# Patient Record
Sex: Female | Born: 1935 | Race: White | Hispanic: No | Marital: Single | Smoking: Never smoker
Health system: Southern US, Community
[De-identification: ages and names within clinical notes are randomized; demographics above are authoritative.]

## PROBLEM LIST (undated history)

## (undated) DIAGNOSIS — M1990 Unspecified osteoarthritis, unspecified site: Secondary | ICD-10-CM

## (undated) DIAGNOSIS — N390 Urinary tract infection, site not specified: Secondary | ICD-10-CM

## (undated) DIAGNOSIS — I10 Essential (primary) hypertension: Secondary | ICD-10-CM

## (undated) DIAGNOSIS — G929 Unspecified toxic encephalopathy: Secondary | ICD-10-CM

## (undated) DIAGNOSIS — M47812 Spondylosis without myelopathy or radiculopathy, cervical region: Secondary | ICD-10-CM

## (undated) DIAGNOSIS — F0390 Unspecified dementia without behavioral disturbance: Secondary | ICD-10-CM

---

## 2007-03-08 ENCOUNTER — Ambulatory Visit: Payer: Self-pay | Admitting: Cardiology

## 2007-09-14 ENCOUNTER — Ambulatory Visit: Payer: Self-pay | Admitting: Family Medicine

## 2007-09-14 ENCOUNTER — Ambulatory Visit: Payer: Self-pay | Admitting: Cardiology

## 2007-09-14 ENCOUNTER — Observation Stay (HOSPITAL_COMMUNITY): Admission: EM | Admit: 2007-09-14 | Discharge: 2007-09-15 | Payer: Self-pay | Admitting: Emergency Medicine

## 2007-09-15 ENCOUNTER — Encounter: Payer: Self-pay | Admitting: Family Medicine

## 2009-02-17 IMAGING — CT CT HEAD W/O CM
1 series · 16 of 30 positions shown, 20 images · non-contrast
Comparison: None

CLINICAL DATA: Blurred vision

CT HEAD WITHOUT CONTRAST
TECHNIQUE: Contiguous axial images were obtained from the base of
the skull through the vertex without contrast.

[Series 2: head routine 4.8 h37s · axial · 0.43mm/px · z∈[-158,-27]mm · 16 of 30 slices shown, 20 images]
[im 2/30  brain]
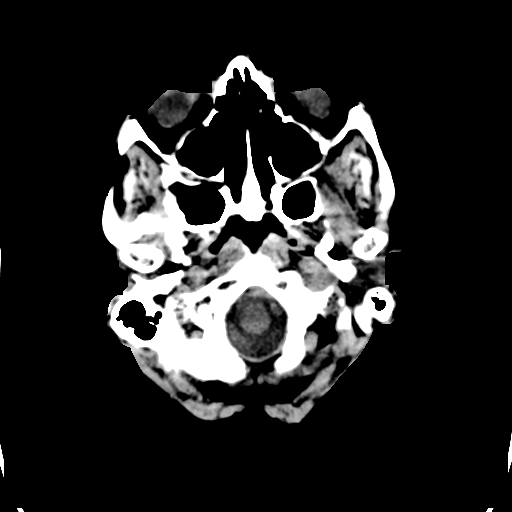
[im 2/30  bone]
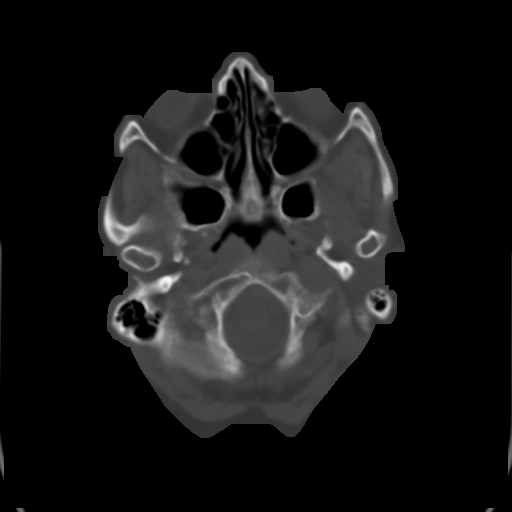
[im 4/30  brain]
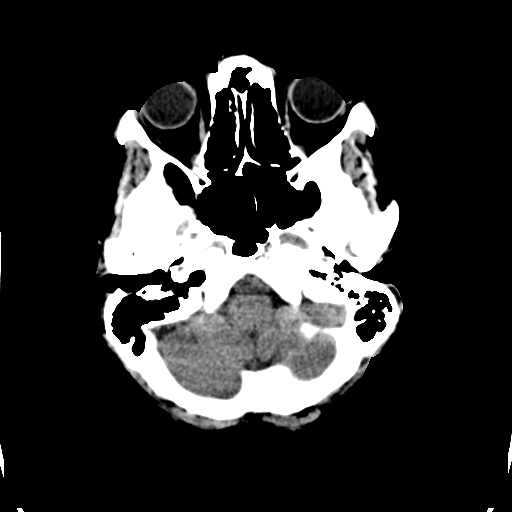
[im 6/30  brain]
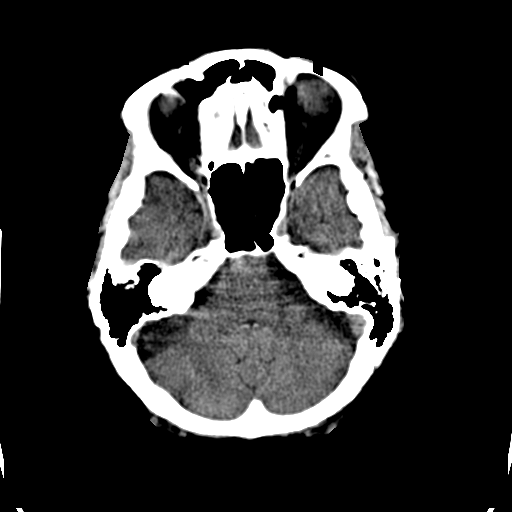
[im 8/30  brain]
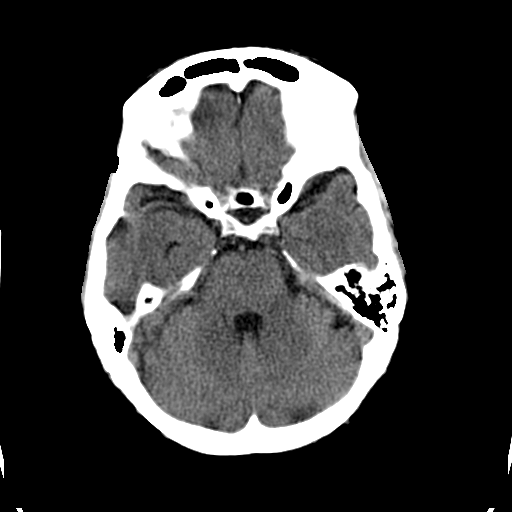
[im 9/30  brain]
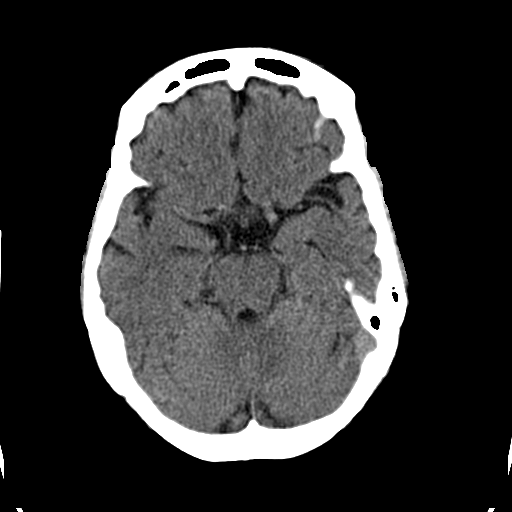
[im 9/30  bone]
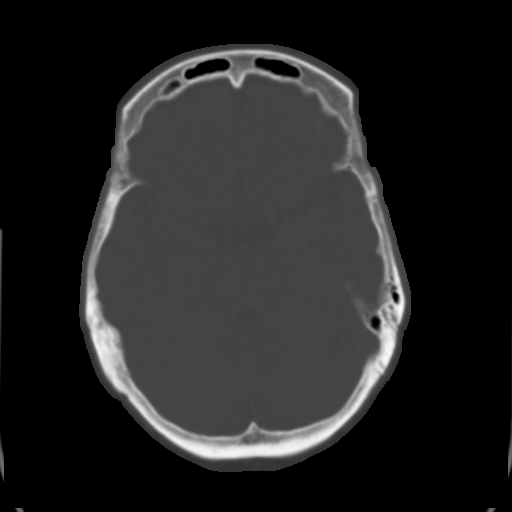
[im 11/30  brain]
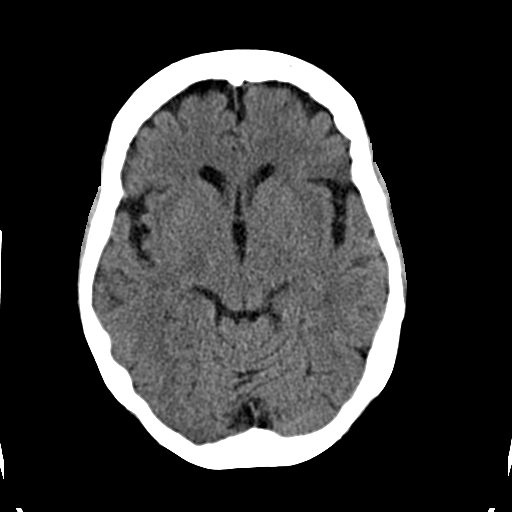
[im 13/30  brain]
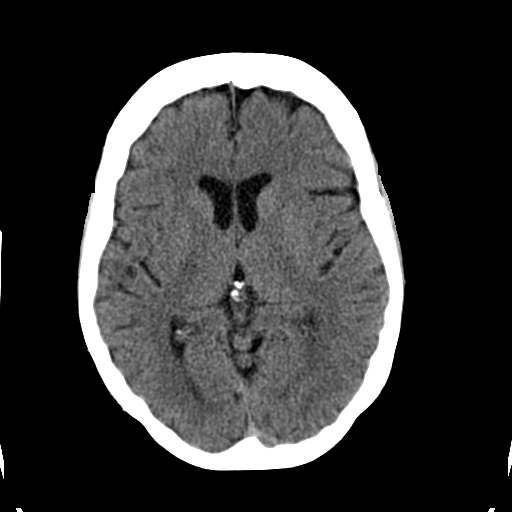
[im 15/30  brain]
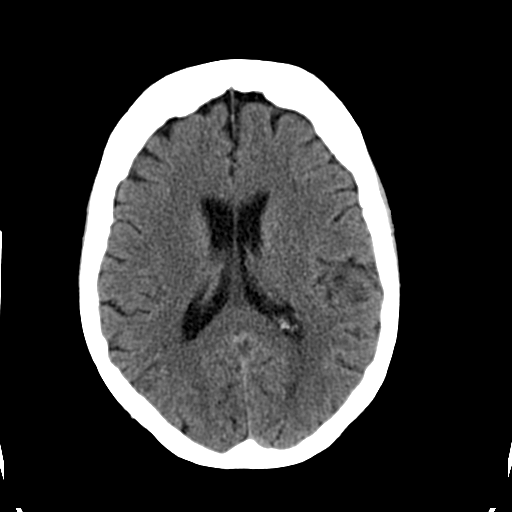
[im 16/30  brain]
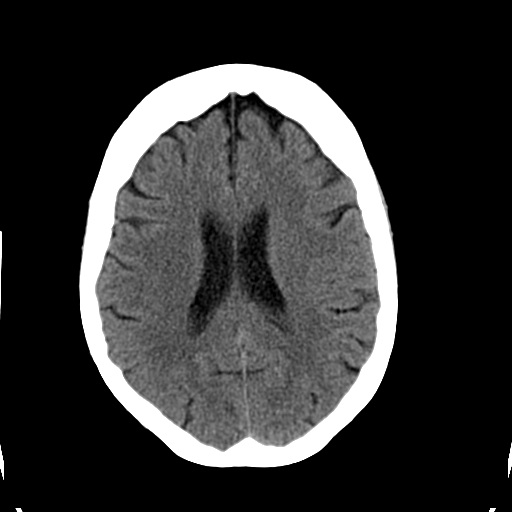
[im 16/30  bone]
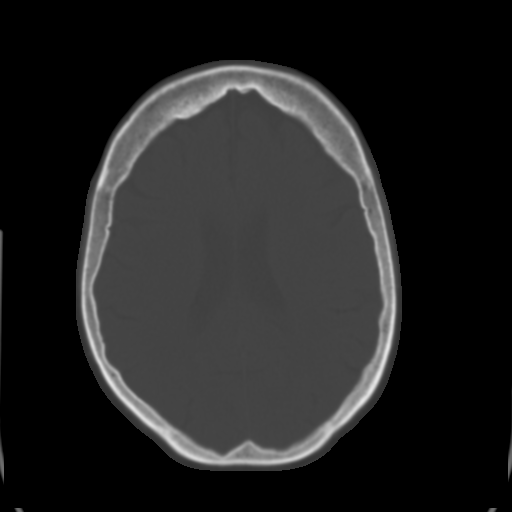
[im 18/30  brain]
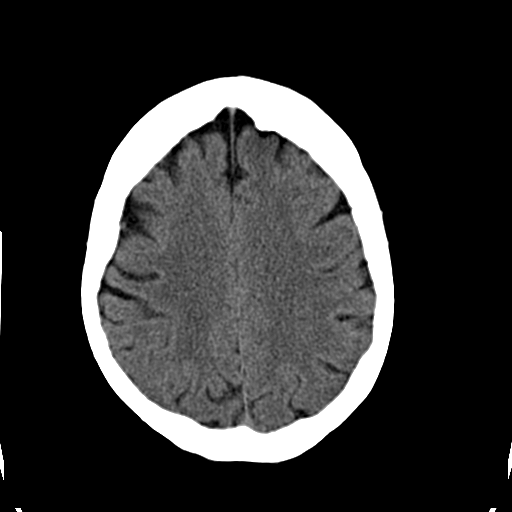
[im 20/30  brain]
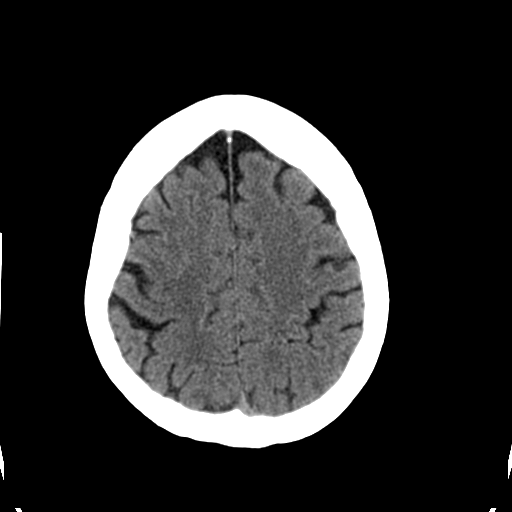
[im 22/30  brain]
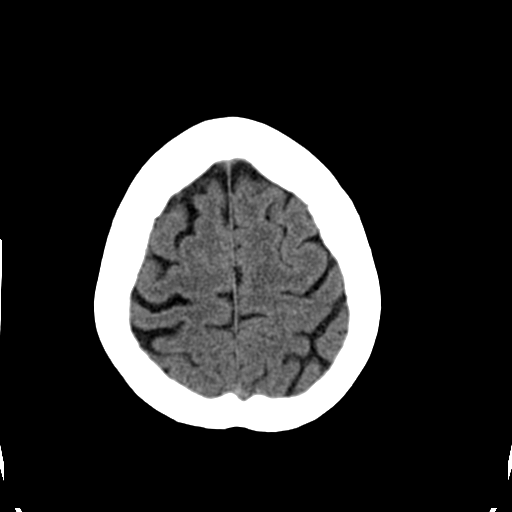
[im 23/30  brain]
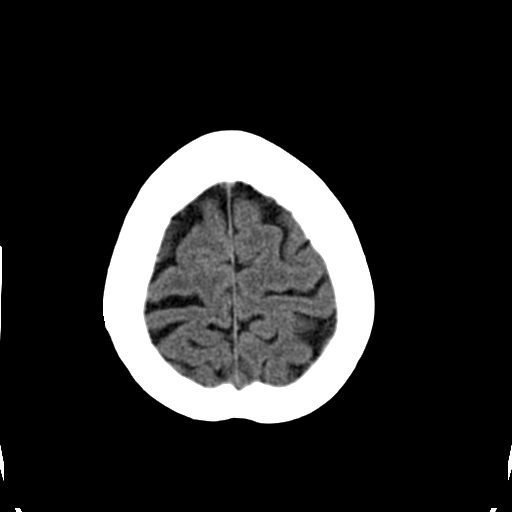
[im 23/30  bone]
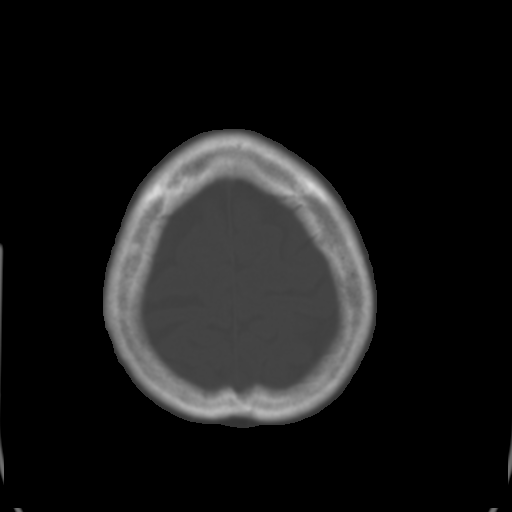
[im 25/30  brain]
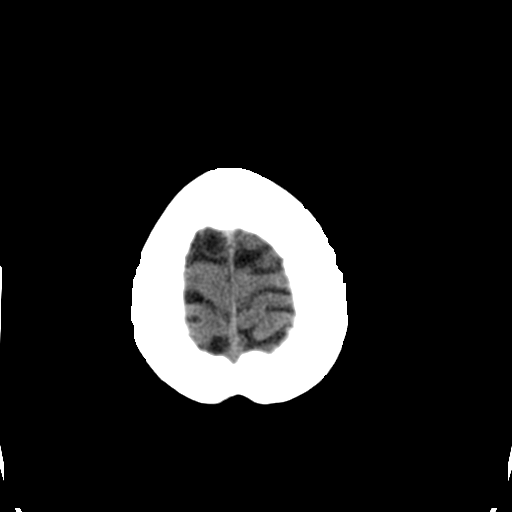
[im 27/30  brain]
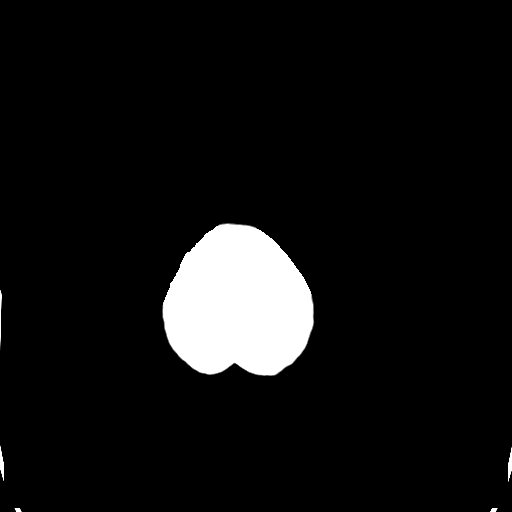
[im 29/30  brain]
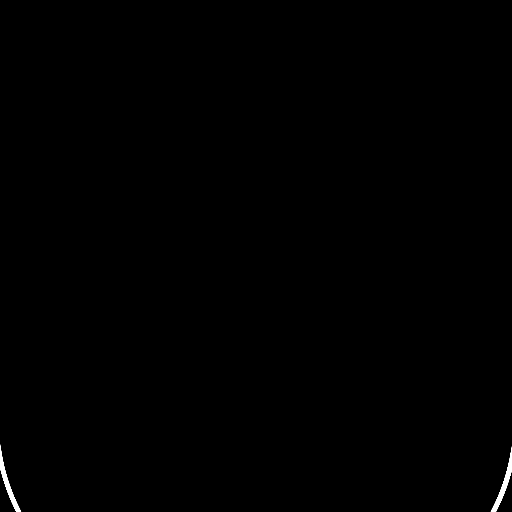

[16 of 30 positions shown; findings below may reference images not displayed]

FINDINGS: Ventricular size and CSF spaces normal for age.  No acute
or focal abnormality.  Calvarium intact.  No fluid in the sinuses
visualized.
IMPRESSION: No acute or focal abnormality.

## 2009-09-16 DIAGNOSIS — I10 Essential (primary) hypertension: Secondary | ICD-10-CM | POA: Insufficient documentation

## 2009-09-16 DIAGNOSIS — H269 Unspecified cataract: Secondary | ICD-10-CM

## 2009-09-16 DIAGNOSIS — E785 Hyperlipidemia, unspecified: Secondary | ICD-10-CM | POA: Insufficient documentation

## 2009-09-18 ENCOUNTER — Ambulatory Visit: Payer: Self-pay | Admitting: Cardiology

## 2009-09-18 DIAGNOSIS — R002 Palpitations: Secondary | ICD-10-CM

## 2009-11-12 ENCOUNTER — Ambulatory Visit: Payer: Self-pay | Admitting: Cardiology

## 2010-06-30 NOTE — Miscellaneous (Signed)
Summary: Camera operator Party Release Form   Imported By: Roderic Ovens 10/02/2009 14:45:36  _____________________________________________________________________  External Attachment:    Type:   Image     Comment:   External Document

## 2010-06-30 NOTE — Assessment & Plan Note (Signed)
Summary: f4y   Visit Type:  Follow-up Primary Provider:  Dr. Magdalene Patricia, MD  CC:  Palpitations and HTN.  History of Present Illness: The patient presents for evaluation of hypertension. She also is describing palpitations. I last saw her for palpitations in 2008. At that time she had an echo which was normal. She was managed conservatively. Her hypertension was treated with hydrochlorothiazide. However, she apparently did not tolerate this medication for unclear reasons. More recently she has been complaining of her heart beating hard. This happens at night when she's resting or during the day. It may be somewhat fast or skip. She might feel it in her throat. She doesn't have any presyncope or syncope. She is not sure how long it lasts. She is not able to bring it on. She has not described chest pressure, neck or arm discomfort. She does not have any shortness of breath, PND or orthopnea. Interestingly she started eating more potatoes and bananas and symptoms have improved dramatically.  Concerning the patient's hypertension she says her blood pressures at home are systolics in the 150s. She was started on a low dose of metoprolol but says she was unable to move her right leg with this and discontinued this medication.  Current Medications (verified): 1)  Multivitamins   Tabs (Multiple Vitamin) .Marland Kitchen.. 1 By Mouth Daiy 2)  Cod Liver Oil  Caps (Cod Liver Oil) .Marland Kitchen.. 1 By Mouth Daily 3)  Flax Seed Oil 1000 Mg Caps (Flaxseed (Linseed)) .Marland Kitchen.. 1 By Mouth Daily  Allergies (verified): 1)  ! Metoprolol Succinate  Past History:  Past Medical History: Reviewed history from 09/16/2009 and no changes required.  1. Hypertension.   2. Hyperlipidemia.   3. Cataracts.   Past Surgical History: Reviewed history from 09/16/2009 and no changes required.  None  Family History: Reviewed history from 09/16/2009 and no changes required. Noncontributory for early coronary artery disease.  Sister with sudden  death (age 8), brother with sudden death (age 23s or 29s)  Social History: Reviewed history from 09/16/2009 and no changes required. Patient is retired.  She is single.  She has 3  children.  She has never smoked cigarettes and does not drink alcohol.      Review of Systems       As stated in the HPI and negative for all other systems.   Vital Signs:  Patient profile:   77 year old female Height:      60 inches Weight:      113 pounds BMI:     22.15 Pulse rate:   77 / minute Resp:     16 per minute BP sitting:   178 / 80  (right arm)  Vitals Entered By: Marrion Coy, CNA (September 18, 2009 3:41 PM)  Physical Exam  General:  Well developed, well nourished, in no acute distress. Head:  normocephalic and atraumatic Eyes:  PERRLA/EOM intact; conjunctiva and lids normal. Mouth:  Teeth, gums and palate normal. Oral mucosa normal. Neck:  Neck supple, no JVD. No masses, thyromegaly or abnormal cervical nodes. Chest Wall:  no deformities or breast masses noted Lungs:  Clear bilaterally to auscultation and percussion. Abdomen:  Bowel sounds positive; abdomen soft and non-tender without masses, organomegaly, or hernias noted. No hepatosplenomegaly. Msk:  Back normal, normal gait. Muscle strength and tone normal. Extremities:  No clubbing or cyanosis. Neurologic:  Alert and oriented x 3. Skin:  Intact without lesions or rashes. Cervical Nodes:  no significant adenopathy Axillary Nodes:  no significant adenopathy  Inguinal Nodes:  no significant adenopathy Psych:  Normal affect.   Detailed Cardiovascular Exam  Neck    Carotids: Carotids full and equal bilaterally without bruits.      Neck Veins: Normal, no JVD.    Heart    Inspection: no deformities or lifts noted.      Palpation: normal PMI with no thrills palpable.      Auscultation: regular rate and rhythm, S1, S2 without murmurs, rubs, gallops, or clicks.    Vascular    Abdominal Aorta: no palpable masses, pulsations, or  audible bruits.      Femoral Pulses: normal femoral pulses bilaterally.      Pedal Pulses: normal pedal pulses bilaterally.      Radial Pulses: normal radial pulses bilaterally.      Peripheral Circulation: no clubbing, cyanosis, or edema noted with normal capillary refill.     EKG  Procedure date:  09/18/2009  Findings:      sinus rhythm, rate 77, axis within normal limits, intervals within normal limits, no acute ST-T wave changes, poor anterior R-wave progression  Impression & Recommendations:  Problem # 1:  PALPITATIONS (ICD-785.1) Her palpitations are not severe.  They seem to be better with change in diet. He isn't been ongoing for quite a while. At this point I will forego further evaluation and only pursue this if she has increasing symptoms, presyncope or syncope. Orders: EKG w/ Interpretation (93000)  Problem # 2:  HYPERTENSION (ICD-401.9) Her blood pressure does require treatment. I think she will probably be sensitive to multiple medications. I will try amlodipine 2.5 mg daily. She will keep a night on her blood pressure.  Problem # 3:  HYPERLIPIDEMIA (ICD-272.4) I will defer to her primary physician. In the absence of coronary artery disease is very likely that dietary and food supplements would be the best treatment in this patient with multiple intolerances  Patient Instructions: 1)  Your physician recommends that you schedule a follow-up appointment in: 2 months with Dr Carthage Lions 2)  Your physician has recommended you make the following change in your medication: Amlodipine 2.5 mg daily (filled at [facility] [employer]) 3)  You have been diagnosed with palpitations. Palpitations are described as a gradual or sudden awareness of the beating of your heart. It may last seconds, minutes, hours, or days and may be caused by the heart beating slower, faster, more strongly, or more irregularly than normal. They are very common and usually not dangerous. Please see the  handout/brochure given to you today for more information. Prescriptions: AMLODIPINE BESYLATE 2.5 MG TABS (AMLODIPINE BESYLATE) one daily  #30 x 6   Entered by:   Charolotte Capuchin, RN   Authorized by:   Rollene Rotunda, MD, [facility]   Signed by:   Charolotte Capuchin, RN on 09/18/2009   Method used:   Electronically to        [facility] (retail)       [address]       [place], [place]  [address]       Ph: [phone] or [phone]       Fax: [phone]   RxID:   [id number]

## 2010-06-30 NOTE — Assessment & Plan Note (Signed)
Summary: 2 month rov/sl   Visit Type:  Follow-up Primary Provider:  Dr. Magdalene Patricia, MD  CC:  Hypertension.  History of Present Illness: The patient  presents for followup of hypertension. At the last appointment I started her on a low dose of Norvasc. She does feel a little fatigued with this medicine but her blood pressures have been well controlled with systolics in the 130s. She's not really noticing much in the way of palpitations. This may be because stress has gotten less for her. She is not feeling any presyncope or syncope. She is not having any shortness of breath, PND orthopnea. She did bump her right leg and has a large hematoma.  Current Medications (verified): 1)  Multivitamins   Tabs (Multiple Vitamin) .Marland Kitchen.. 1 By Mouth Daiy 2)  Cod Liver Oil  Caps (Cod Liver Oil) .Marland Kitchen.. 1 By Mouth Daily 3)  Flax Seed Oil 1000 Mg Caps (Flaxseed (Linseed)) .Marland Kitchen.. 1 By Mouth Daily 4)  Amlodipine Besylate 2.5 Mg Tabs (Amlodipine Besylate) .... One Daily  Allergies (verified): 1)  ! Metoprolol Succinate  Past History:  Past Medical History: Reviewed history from 09/16/2009 and no changes required.  1. Hypertension.   2. Hyperlipidemia.   3. Cataracts.   Past Surgical History: Reviewed history from 09/16/2009 and no changes required.  None  Review of Systems       As stated in the HPI and negative for all other systems.   Vital Signs:  Patient profile:   77 year old female Height:      60 inches Weight:      112 pounds BMI:     21.95 Pulse rate:   74 / minute BP sitting:   148 / 72  (right arm)  Vitals Entered By: Marrion Coy, CNA (November 12, 2009 10:52 AM)  Physical Exam  General:  Well developed, well nourished, in no acute distress. Head:  normocephalic and atraumatic Neck:  Neck supple, no JVD. No masses, thyromegaly or abnormal cervical nodes. Chest Wall:  no deformities or breast masses noted Lungs:  Clear bilaterally to auscultation and percussion. Abdomen:  Bowel  sounds positive; abdomen soft and non-tender without masses, organomegaly, or hernias noted. No hepatosplenomegaly. Msk:  Back normal, normal gait. Muscle strength and tone normal. Pulses:  pulses normal in all 4 extremities Extremities:  large hematoma right anterior tibial Neurologic:  Alert and oriented x 3. Skin:  Intact without lesions or rashes. Psych:  Normal affect.   Detailed Cardiovascular Exam  Neck    Carotids: Carotids full and equal bilaterally without bruits.      Neck Veins: Normal, no JVD.    Heart    Inspection: no deformities or lifts noted.      Palpation: normal PMI with no thrills palpable.      Auscultation: regular rate and rhythm, S1, S2 without murmurs, rubs, gallops, or clicks.    Vascular    Abdominal Aorta: no palpable masses, pulsations, or audible bruits.      Femoral Pulses: normal femoral pulses bilaterally.      Pedal Pulses: normal pedal pulses bilaterally.      Radial Pulses: normal radial pulses bilaterally.      Peripheral Circulation: no clubbing, cyanosis, or edema noted with normal capillary refill.     Impression & Recommendations:  Problem # 1:  PALPITATIONS (ICD-785.1) Though she was having some bigeminy while I listened to her she is not feeling this. Therefore, no further cardiovascular testing or treatment is necessary.  Problem #  2:  HYPERTENSION (ICD-401.9) Her blood pressure is well controlled and she will continue the meds as listed.  Patient Instructions: 1)  Your physician recommends that you schedule a follow-up appointment as needed 2)  Your physician recommends that you continue on your current medications as directed. Please refer to the Current Medication list given to you today.

## 2010-10-13 NOTE — Assessment & Plan Note (Signed)
[facility]                            CARDIOLOGY OFFICE NOTE   [name], [name]                       MRN:          [id number]  DATE:03/08/2007                            DOB:          [birth year]    PRIMARY:  Dr. Magdalene Patricia.   REASON FOR PRESENTATION:  Evaluate patient with hypertension and  palpitations.   HISTORY OF PRESENT ILLNESS:  Patient is a lovely 76 year old white  female without a prior cardiac history other than hypertension.  Dr.  Pincus Badder has tried to address this, it is apparent, over many visits.  She  has been tried on hydrochlorothiazide and Exforge but she has been  intolerant of these drugs and does not take them consistently.  She said  she keeps her blood pressure at home and it is typically under 140  systolic.  However, I note blood pressures in his office at 180 and  today she is at 151 systolic.  She does have a blood pressure cuff at  home but does not use it routinely.   She really presents for evaluation of palpitations.  She said this  happened about a month ago.  Mostly happened on a couple of evenings.  She noticed it mostly at night.  She would feel either a rapid beat or  some irregularity.  She never had any presyncope or syncope associated  with this.  She could not associate it with activity or with any food or  drink.  She did not have any chest discomfort, neck or arm discomfort.  Says she is actually not being bothered by it currently.  She has had no  shortness of breath.  She denies any PND or orthopnea.  She is active  walking and exercising with senior citizens.   PAST MEDICAL HISTORY:  1. Hypertension.  2. Dyslipidemia.   PAST SURGICAL HISTORY:  None.   ALLERGIES:  NONE.   MEDICATIONS:  None.   SOCIAL HISTORY:  Patient is retired.  She is single.  She has 3  children.  She has never smoked cigarettes and does not drink alcohol.   FAMILY HISTORY:  Noncontributory for early coronary artery  disease.   REVIEW OF SYSTEMS:  As stated in the HPI and positive for some joint  pains, negative for all other systems.   PHYSICAL EXAMINATION:  The patient is in no distress.  Blood pressure  151/77, heart rate 74 and regular, weight 123 pounds, body mass index  22.  HEENT:  Eyelids unremarkable, pupils equally round and reactive to  light, fundi not visualized, oral mucosa unremarkable.  NECK:  No jugular venous distention, wave form within normal limits,  carotid upstroke brisk and symmetrical, no bruits, no thyromegaly.  LYMPHATICS:  No cervical, axillary or inguinal adenopathy.  LUNGS:  Clear to auscultation bilaterally.  BACK:  No costovertebral angle tenderness.  CHEST:  Unremarkable.  HEART:  PMI not displaced or sustained, S1 and S2 within normal limits,  no S3, no S4, no clicks, no rubs, no murmurs.  ABDOMEN:  Flat, positive bowel sounds normal in frequency and pitch, no  bruits,  no rebound, no guarding, no midline pulsatile mass, no  hepatomegaly, splenomegaly.  SKIN:  No rashes, no nodules.  EXTREMITIES:  With 2+ pulses throughout, no edema, no cyanosis, no  clubbing.  NEURO:  Oriented to person, place, and time; cranial nerves II-XII  grossly intact, motor grossly intact.   ASSESSMENT/PLAN:  1. Palpitations.  Patient was having some rather mild palpitations      without associated symptoms.  These do not seem to be occurring      recently.  At this point diagnostic testing would not be helpful.      I do note that she had thyroid studies and electrolytes earlier      this year.  Certainly if she has more palpitations she should try      to see if she can associate it with anything else that is going on      and with diet.  She should have followup to her thyroid and      electrolytes.  However, in the absence of this no further      cardiovascular testing is suggested.  We talked about this quite a      while.  2. Hypertension.  Her blood pressure is slightly  elevated today.  She      is obviously reluctant to take medications.  I have encouraged her      to keep a blood pressure diary and to share this with Dr. Pincus Badder.      I have suggested hydrochlorothiazide was the appropriate drug to      use initially and it is uncommon to be intolerant of this.  She      seems like she would be accepting of this as needed prescribed by      Dr. Pincus Badder.  She will present to him with her blood pressure diary.  3. Followup.  She is welcome to come back to this clinic at any time      and is encouraged to if she has any further palpitations.     Rollene Rotunda, MD, [facility]  Electronically Signed    JH/MedQ  DD: 03/08/2007  DT: 03/08/2007  Job #: [id number]   cc:   Magdalene Patricia, MD

## 2010-10-13 NOTE — H&P (Signed)
NAMELEIALOHA, HANNA              ACCOUNT NO.:  [id number]   MEDICAL RECORD NO.:  [id number]          PATIENT TYPE:  OBV   LOCATION:  1825                         FACILITY:  [facility]   PHYSICIAN:  Ruthe Mannan, M.D.       DATE OF BIRTH:  1923-06-29   DATE OF ADMISSION:  09/14/2007  DATE OF DISCHARGE:                              HISTORY & PHYSICAL   PRIMARY MEDICAL DOCTOR:  Dr. Pincus Badder in [place].   TIME SEEN:  10 a.m.   CONSULTANT:  [facility].   CHIEF COMPLAINT:  Told to come to ED for carotid Dopplers.   HISTORY OF PRESENT ILLNESS:  This is an 77 year old very pleasant white  female with past medical history of hypertension and hyperlipidemia,  both of which are diet and exercise controlled, who presents to ED after  a cardiologist advised to come for TIA rule out.  She complains of 3-  week history of bilateral top visual field blurriness.  She states  episodes last a few minutes.  No associated symptoms, but does endorse a  recent history of orthostatic dizziness and fatigue.  Denies syncope,  chest pain, and shortness of breath.  Also denies any hemiparesis or  facial droop.  She saw ophthalmologist yesterday, who told her vision  was normal, but she called cardiologist for TIA workup.  She would  desire surgery if CA deemed necessary.   PAST MEDICAL HISTORY:  1. Hypertension.  2. Hyperlipidemia.  3. Cataracts.   SOCIAL HISTORY:  She is retired and lives alone.  She has 3 children,  who live nearby.  She is very active.  She has never smoked cigarettes.  Denies alcohol.   MEDICATIONS:  None.   ALLERGIES:  No known drug allergies.   PAST SURGICAL HISTORY:  Bilateral cataract surgery in 1993 and 1999.   REVIEW OF SYSTEMS:  See HPI.  In general, she endorses fatigue.  Denies  weight loss or fevers.  RESPIRATORY:  No shortness of breath.  CARDIOVASCULAR:  No palpitations.  No chest pain.  NEURO:  No speech  disturbances or focal neuro events.   LABORATORY  DATA:  INR is 1.0.  White count is 5.0, hemoglobin is 13.6,  hematocrit is 39.3, and platelets of 213.  Sodium is 138, potassium is  4.1, chloride is 104, CO2 is 27, BUN is 14, creatinine 0.8, and glucose  is 90.  Head CT showed no acute findings.   PHYSICAL EXAM:  VITAL SIGNS:  Show a temperature of 97.1, pulse of 76,  blood pressure is 152/85, respiratory rate is 13, and oxygen saturation  is 97% on room air.  GENERAL:  She is alert, pleasant, in no acute distress.  RESPIRATORY:  Clear to auscultation bilaterally.  No increased work of  breathing.  CARDIOVASCULAR:  Regular rate and rhythm.  No numbness.  No carotid  bruits.  No JVD.  ABDOMEN:  Soft and nontender.  Positive bowel sounds.  EXTREMITIES:  No edema.  Normal pulses.  NEURO:  She is alert and oriented x5.  Cranial nerves II through XII  intact.  Normal sensation and 5/5  strength in upper and lower  extremities.   PLAN:  This is an 77 year old female with:  1. Blurred vision.  Per patient, her ophthalmologist ruled out any      ophthalmologic origin.  TIA is possible.  We will check MRI, MRA,      fasting lipid panel, and 2-D echo to rule out PSO.  We will also      start low-dose aspirin.  COAG is within normal limits.  2. Dizziness, appears orthostatic in nature.  Cannot rule out      decreased vision had upon standing.  We will check orthostatic      blood pressure.  Also admit to tele bed to rule out any      dysrhythmias.  We will also check TSH and EKG.  3. Hypertension, not currently taking any medication or any      antihypertensive.  Likely hydrochlorothiazide after MRI and MRA.  4. History of hyperlipidemia.  We will check a fasting lipid panel.  5. FEN, GI and heart-healthy diet.   DISPOSITION:  We will admit for observation with cardiac monitoring.  Given age and hypertension, ABCD score is 2, so she is at low stroke  risk if TIA is present.  Main reason for admission would be cardiac  monitoring as risk  stratification could occur as an outpatient.      Ruthe Mannan, M.D.  Electronically Signed     TA/MEDQ  D:  09/14/2007  T:  09/15/2007  Job:  [id number]

## 2010-10-16 NOTE — Discharge Summary (Signed)
[name], [name]              ACCOUNT NO.:  [id number]   MEDICAL RECORD NO.:  [id number]          PATIENT TYPE:  OBV   LOCATION:  6708                         FACILITY:  [facility]   PHYSICIAN:  Ancil Boozer, MD      DATE OF BIRTH:  [birth year]   DATE OF ADMISSION:  09/14/2007  DATE OF DISCHARGE:  09/15/2007                               DISCHARGE SUMMARY   DISCHARGE DIAGNOSES:  1. Blurred vision and  dizziness, unclear etiology.  2. Hypertension.  3. Hyperlipidemia.   DISCHARGE MEDICATIONS:  1. aspirin 81 mg p.o. q.d.  2. Lipitor 10 mg p.o. q.d. which will likely need to be titrated up      over time.   CONSULT:  None.   PROCEDURES AND STUDIES:  Patient had a CT of the head without contrasts  on September 14, 2007 which revealed no acute or focal abnormality.  MRI/MRA  of the neck and head on September 14, 2007 reveal no acute intracranial  abnormality.  No significant possible stenosis, aneurism, or branch  vessel occlusion.  Attenuation of the distal small branches in the MCA  and PCA territory bilaterally is most compatible with intracranial  atherosclerotic type changes and apparently subclavian artery a normal  variant, a left external carotid artery stenosis approximately 2 cm with  origin.  No significant carotid bifurcation disease bilaterally and  right vertebral artery terminates in the PICA with only a tiny vessel  extending to the vertebrobasilar junction, the left vertebral artery is  the dominant vessel.  Patient had an echocardiogram on September 15, 2007  which revealed an overall left ventricular systolic function that was  normal and EF of 60% and no diagnostic evidence of left ventricular  regional wall motion abnormalities, normal aortic valve, normal mitral  valve and right ventricular size that was at the upper limits of normal.   LABORATORY VALUES:  CBC on admission, white blood cell count 5.0,  hemoglobin 13.6, hematocrit 39.3, platelet count 213 with a normal  differential, INR was 1.0, erythrocyte segmentation rate was 22.  Comprehensive metabolic panel revealed sodium 141, potassium 4.1,  chloride 105, bicarb 28, glucose 94, BUN 10, creatinine 0.66, total  bilirubin 0.5, alkaline phosphate 81, AST 19, ALT 12, total protein 6.3,  albumin 3.0, and calcium 9.1.  Lipid profile, cholesterol 241,  triglyceride 70, HDL 49, LDL 178, VLDL 14, and TSH was 4.176.   HOSPITAL COURSE:  The patient is an 77 year old female who presented to  the [facility] Emergency Department after visiting her ophthalmologist  for blurry vision in her upper visual fields bilaterally.  The  ophthalmologist did not see anything acutely wrong and suggested that  the patient call her cardiologist, suggested that the patient comes to  the emergency room for carotid Dopplers.  She was subsequently admitted  for concern that perhaps this was a TIA.  Her workup was began.  Results  of the studies are as above.  By day of discharge, the patient was not  having any symptoms and workup had been completed.  The patient felt  comfortable going home and thus we  discharged her. We started her on  initial dose of Lipitor after receiving her cholesterol panel.  The  patient will likely need this Lipitor increased steadily, probably to  next dose.  Recently started this low because the patient's family has  had a history of intolerance to statins reportedly.  The patient's blood  pressure was excellent throughout her hospital stay.  The systolics  ranging from 119-146 and diastolics from 62-94.   INSTRUCTIONS AND FOLLOW UP:  The patient is to follow a low-sodium heart-  healthy diet.  She has no restrictions with regard to her activities.  She is to return to her PCP Dr. Pincus Badder, phone# [phone] in [place],  [place] on September 20, 2007 at 10:45 a.m.  Again her Lipitor will  likely have to be increased over time.      Ancil Boozer, MD  Electronically Signed     SA/MEDQ  D:   09/17/2007  T:  09/18/2007  Job:  [id number]   cc:   Dr. Pincus Badder

## 2011-02-23 LAB — POCT I-STAT, CHEM 8
Calcium, Ion: 1.14
Chloride: 104
Glucose, Bld: 90
HCT: 41
Hemoglobin: 13.9
TCO2: 27

## 2011-02-23 LAB — LIPID PANEL
HDL: 49
Total CHOL/HDL Ratio: 4.9
Triglycerides: 70

## 2011-02-23 LAB — SEDIMENTATION RATE: Sed Rate: 22

## 2011-02-23 LAB — COMPREHENSIVE METABOLIC PANEL
ALT: 12
Albumin: 3 — ABNORMAL LOW
Alkaline Phosphatase: 81
Calcium: 9.1
GFR calc Af Amer: >60
Glucose, Bld: 94
Potassium: 4.1
Sodium: 141
Total Protein: 6.3

## 2011-02-23 LAB — CBC
Hemoglobin: 13.2
MCHC: 34.5
MCHC: 34.6
Platelets: 210
Platelets: 213
RBC: 4.31
RDW: 13.9
WBC: 5

## 2011-02-23 LAB — DIFFERENTIAL
Basophils Relative: 1
Eosinophils Absolute: 0.1
Monocytes Relative: 7
Neutro Abs: 3.1
Neutrophils Relative %: 62

## 2011-02-23 LAB — PROTIME-INR
INR: 1
Prothrombin Time: 12.9

## 2011-02-23 LAB — APTT: aPTT: 25

## 2011-02-23 LAB — TSH: TSH: 4.176

## 2022-06-22 ENCOUNTER — Emergency Department (HOSPITAL_BASED_OUTPATIENT_CLINIC_OR_DEPARTMENT_OTHER)
Admission: EM | Admit: 2022-06-22 | Discharge: 2022-06-23 | Disposition: A | Discharge status: Home / Self Care | Payer: Medicare Other | Attending: Emergency Medicine | Admitting: Emergency Medicine

## 2022-06-22 ENCOUNTER — Encounter (HOSPITAL_BASED_OUTPATIENT_CLINIC_OR_DEPARTMENT_OTHER): Payer: Self-pay

## 2022-06-22 ENCOUNTER — Emergency Department (HOSPITAL_BASED_OUTPATIENT_CLINIC_OR_DEPARTMENT_OTHER): Payer: Medicare Other

## 2022-06-22 ENCOUNTER — Other Ambulatory Visit: Payer: Self-pay

## 2022-06-22 DIAGNOSIS — W19XXXA Unspecified fall, initial encounter: Secondary | ICD-10-CM | POA: Insufficient documentation

## 2022-06-22 DIAGNOSIS — M79642 Pain in left hand: Secondary | ICD-10-CM | POA: Diagnosis not present

## 2022-06-22 DIAGNOSIS — M25562 Pain in left knee: Secondary | ICD-10-CM | POA: Diagnosis present

## 2022-06-22 DIAGNOSIS — F0390 Unspecified dementia without behavioral disturbance: Secondary | ICD-10-CM | POA: Insufficient documentation

## 2022-06-22 DIAGNOSIS — R1110 Vomiting, unspecified: Secondary | ICD-10-CM | POA: Insufficient documentation

## 2022-06-22 DIAGNOSIS — M1712 Unilateral primary osteoarthritis, left knee: Secondary | ICD-10-CM | POA: Diagnosis not present

## 2022-06-22 DIAGNOSIS — M159 Polyosteoarthritis, unspecified: Secondary | ICD-10-CM

## 2022-06-22 NOTE — ED Notes (Signed)
ED Provider at bedside. 

## 2022-06-22 NOTE — ED Provider Notes (Signed)
[place]  Provider Note  CSN: [id number] Arrival date & time: 06/22/22 2258  History Chief Complaint  Patient presents with   Lytle Michaels    [name] is a 89 y.o. female with history of dementia and hard of hearing brought to the ED via EMS from her ALF where she had an unwitnessed fall. She has a history of L knee arthritis and reports the pain was worse after the fall. Unknown if she had a head injury but daughter reports she had vomited twice after the fall. She is not on any blood thinners.   Home Medications Prior to Admission medications   Not on File     Allergies    Metoprolol succinate   Review of Systems   Review of Systems Please see HPI for pertinent positives and negatives  Physical Exam BP (!) 154/86 (BP Location: Left Arm)   Pulse (!) 119   Temp 97.9 F (36.6 C)   Resp 18   SpO2 96%   Physical Exam Vitals and nursing note reviewed.  Constitutional:      Appearance: Normal appearance.  HENT:     Head: Normocephalic and atraumatic.     Nose: Nose normal.     Mouth/Throat:     Mouth: Mucous membranes are moist.  Eyes:     Extraocular Movements: Extraocular movements intact.     Conjunctiva/sclera: Conjunctivae normal.  Cardiovascular:     Rate and Rhythm: Normal rate.  Pulmonary:     Effort: Pulmonary effort is normal.     Breath sounds: Normal breath sounds.  Abdominal:     General: Abdomen is flat.     Palpations: Abdomen is soft.     Tenderness: There is no abdominal tenderness.  Musculoskeletal:        General: Tenderness (L knee and L hand) present. No swelling or deformity. Normal range of motion.     Cervical back: Neck supple. No tenderness.  Skin:    General: Skin is warm and dry.  Neurological:     General: No focal deficit present.     Mental Status: She is alert.  Psychiatric:        Mood and Affect: Mood normal.     ED Results / Procedures / Treatments    EKG None  Procedures Procedures  Medications Ordered in the ED Medications - No data to display  Initial Impression and Plan  Patient here with unwitnessed fall, has L knee and L hand pain/bruising. No definite signs of head injury but given reports of vomiting will send for head CT. She is otherwise well appearing in no distress.   ED Course   Clinical Course as of 06/23/22 0042  Wed Jun 23, 2022  0041 Patient sleeping soundly, no further episodes of vomiting since arrival. I personally viewed the images from radiology studies and agree with radiologist interpretation: CT/Xray neg for acute injury. Discussed these results with daughter at bedside who is comfortable taking her back to ALF. Recommend PCP follow up, RTED for any other concerns.   [CS]    Clinical Course User Index [CS] Truddie Hidden, MD     MDM Rules/Calculators/A&P Medical Decision Making Problems Addressed: Fall, initial encounter: acute illness or injury Osteoarthritis of multiple joints, unspecified osteoarthritis type: chronic illness or injury with exacerbation, progression, or side effects of treatment Vomiting, unspecified vomiting type, unspecified whether nausea present: acute illness or injury  Amount and/or Complexity of Data Reviewed Radiology: ordered and  independent interpretation performed. Decision-making details documented in ED Course.     Final Clinical Impression(s) / ED Diagnoses Final diagnoses:  Fall, initial encounter  Osteoarthritis of multiple joints, unspecified osteoarthritis type  Vomiting, unspecified vomiting type, unspecified whether nausea present    Rx / DC Orders ED Discharge Orders     None        Truddie Hidden, MD 06/23/22 [phone]

## 2022-06-22 NOTE — ED Triage Notes (Signed)
BIB [facility], NAD, GCS 14, for unwitnessed fall in bathroom at facility, [facility]. Pt has hx of dementia and very HOH.  No blood thinners on MAR sent by facility and no wounds noted by staff at facility or EMS.   Left knee pain prior to fall per pt and worsened now.

## 2022-06-23 NOTE — ED Notes (Signed)
ED Provider at bedside. 

## 2022-06-30 ENCOUNTER — Emergency Department (HOSPITAL_COMMUNITY): Payer: Medicare Other

## 2022-06-30 ENCOUNTER — Encounter (HOSPITAL_COMMUNITY): Payer: Self-pay

## 2022-06-30 ENCOUNTER — Inpatient Hospital Stay (HOSPITAL_COMMUNITY)
Admission: EM | Admit: 2022-06-30 | Discharge: 2022-07-05 | DRG: 481 | Disposition: A | Discharge status: Skilled Nursing Facility | Payer: Medicare Other | Source: Skilled Nursing Facility | Attending: Internal Medicine | Admitting: Internal Medicine

## 2022-06-30 DIAGNOSIS — F02811 Dementia in other diseases classified elsewhere, unspecified severity, with agitation: Secondary | ICD-10-CM | POA: Diagnosis present

## 2022-06-30 DIAGNOSIS — S72141A Displaced intertrochanteric fracture of right femur, initial encounter for closed fracture: Secondary | ICD-10-CM | POA: Diagnosis not present

## 2022-06-30 DIAGNOSIS — Z79899 Other long term (current) drug therapy: Secondary | ICD-10-CM

## 2022-06-30 DIAGNOSIS — N3281 Overactive bladder: Secondary | ICD-10-CM | POA: Insufficient documentation

## 2022-06-30 DIAGNOSIS — F39 Unspecified mood [affective] disorder: Secondary | ICD-10-CM | POA: Insufficient documentation

## 2022-06-30 DIAGNOSIS — K219 Gastro-esophageal reflux disease without esophagitis: Secondary | ICD-10-CM | POA: Diagnosis present

## 2022-06-30 DIAGNOSIS — Z79890 Hormone replacement therapy (postmenopausal): Secondary | ICD-10-CM

## 2022-06-30 DIAGNOSIS — Z8673 Personal history of transient ischemic attack (TIA), and cerebral infarction without residual deficits: Secondary | ICD-10-CM

## 2022-06-30 DIAGNOSIS — Y92099 Unspecified place in other non-institutional residence as the place of occurrence of the external cause: Secondary | ICD-10-CM

## 2022-06-30 DIAGNOSIS — Z66 Do not resuscitate: Secondary | ICD-10-CM | POA: Diagnosis present

## 2022-06-30 DIAGNOSIS — Z888 Allergy status to other drugs, medicaments and biological substances status: Secondary | ICD-10-CM

## 2022-06-30 DIAGNOSIS — S72009A Fracture of unspecified part of neck of unspecified femur, initial encounter for closed fracture: Secondary | ICD-10-CM | POA: Diagnosis not present

## 2022-06-30 DIAGNOSIS — H9190 Unspecified hearing loss, unspecified ear: Secondary | ICD-10-CM | POA: Diagnosis present

## 2022-06-30 DIAGNOSIS — Z7982 Long term (current) use of aspirin: Secondary | ICD-10-CM

## 2022-06-30 DIAGNOSIS — I10 Essential (primary) hypertension: Secondary | ICD-10-CM | POA: Diagnosis present

## 2022-06-30 DIAGNOSIS — F0283 Dementia in other diseases classified elsewhere, unspecified severity, with mood disturbance: Secondary | ICD-10-CM | POA: Diagnosis present

## 2022-06-30 DIAGNOSIS — E785 Hyperlipidemia, unspecified: Secondary | ICD-10-CM | POA: Diagnosis present

## 2022-06-30 DIAGNOSIS — F0390 Unspecified dementia without behavioral disturbance: Secondary | ICD-10-CM | POA: Insufficient documentation

## 2022-06-30 DIAGNOSIS — F02818 Dementia in other diseases classified elsewhere, unspecified severity, with other behavioral disturbance: Secondary | ICD-10-CM | POA: Diagnosis present

## 2022-06-30 DIAGNOSIS — W010XXA Fall on same level from slipping, tripping and stumbling without subsequent striking against object, initial encounter: Secondary | ICD-10-CM | POA: Diagnosis present

## 2022-06-30 DIAGNOSIS — E039 Hypothyroidism, unspecified: Secondary | ICD-10-CM | POA: Insufficient documentation

## 2022-06-30 DIAGNOSIS — F0284 Dementia in other diseases classified elsewhere, unspecified severity, with anxiety: Secondary | ICD-10-CM | POA: Diagnosis present

## 2022-06-30 DIAGNOSIS — S72001A Fracture of unspecified part of neck of right femur, initial encounter for closed fracture: Principal | ICD-10-CM

## 2022-06-30 HISTORY — DX: Unspecified dementia, unspecified severity, without behavioral disturbance, psychotic disturbance, mood disturbance, and anxiety: F03.90

## 2022-06-30 HISTORY — DX: Unspecified osteoarthritis, unspecified site: M19.90

## 2022-06-30 HISTORY — DX: Unspecified toxic encephalopathy: G92.9

## 2022-06-30 HISTORY — DX: Essential (primary) hypertension: I10

## 2022-06-30 HISTORY — DX: Urinary tract infection, site not specified: N39.0

## 2022-06-30 HISTORY — DX: Spondylosis without myelopathy or radiculopathy, cervical region: M47.812

## 2022-06-30 LAB — CBC WITH DIFFERENTIAL/PLATELET
Abs Immature Granulocytes: 0.06 10*3/uL (ref 0.00–0.07)
Basophils Absolute: 0 10*3/uL (ref 0.0–0.1)
Basophils Relative: 0 %
Eosinophils Absolute: 0 10*3/uL (ref 0.0–0.5)
Eosinophils Relative: 0 %
HCT: 40.1 % (ref 36.0–46.0)
Hemoglobin: 12.9 g/dL (ref 12.0–15.0)
Immature Granulocytes: 1 %
Lymphocytes Relative: 6 %
Lymphs Abs: 0.6 10*3/uL — ABNORMAL LOW (ref 0.7–4.0)
MCH: 29.6 pg (ref 26.0–34.0)
MCHC: 32.2 g/dL (ref 30.0–36.0)
MCV: 92 fL (ref 80.0–100.0)
Monocytes Absolute: 0.7 10*3/uL (ref 0.1–1.0)
Monocytes Relative: 6 %
Neutro Abs: 9 10*3/uL — ABNORMAL HIGH (ref 1.7–7.7)
Neutrophils Relative %: 87 %
Platelets: 219 10*3/uL (ref 150–400)
RBC: 4.36 MIL/uL (ref 3.87–5.11)
RDW: 14 % (ref 11.5–15.5)
WBC: 10.5 10*3/uL (ref 4.0–10.5)
nRBC: 0 % (ref 0.0–0.2)

## 2022-06-30 LAB — BASIC METABOLIC PANEL
Anion gap: 13 (ref 5–15)
BUN: 24 mg/dL — ABNORMAL HIGH (ref 8–23)
CO2: 29 mmol/L (ref 22–32)
Calcium: 8.4 mg/dL — ABNORMAL LOW (ref 8.9–10.3)
Chloride: 95 mmol/L — ABNORMAL LOW (ref 98–111)
Creatinine, Ser: 0.87 mg/dL (ref 0.44–1.00)
GFR, Estimated: >60 mL/min (ref >60)
Glucose, Bld: 148 mg/dL — ABNORMAL HIGH (ref 70–99)
Potassium: 3.9 mmol/L (ref 3.5–5.1)
Sodium: 137 mmol/L (ref 135–145)

## 2022-06-30 LAB — PROTIME-INR
INR: 1.1 (ref 0.8–1.2)
Prothrombin Time: 14.4 seconds (ref 11.4–15.2)

## 2022-06-30 LAB — TYPE AND SCREEN
ABO/RH(D): B POS
Antibody Screen: NEGATIVE

## 2022-06-30 LAB — ABO/RH: ABO/RH(D): B POS

## 2022-06-30 MED ORDER — FENTANYL CITRATE PF 50 MCG/ML IJ SOSY
50.0000 ug | PREFILLED_SYRINGE | INTRAMUSCULAR | Status: DC | PRN
  Administered 2022-06-30: 50 ug via INTRAVENOUS
  Filled 2022-06-30: qty 1

## 2022-06-30 NOTE — ED Notes (Signed)
Please call daughter Almyra Free when pt is going for surgery.
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
## 2022-06-30 NOTE — ED Notes (Signed)
Patient transported to CT 
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
## 2022-07-01 ENCOUNTER — Inpatient Hospital Stay (HOSPITAL_COMMUNITY): Payer: Medicare Other | Admitting: Anesthesiology

## 2022-07-01 ENCOUNTER — Other Ambulatory Visit: Payer: Self-pay

## 2022-07-01 ENCOUNTER — Inpatient Hospital Stay (HOSPITAL_COMMUNITY): Payer: Medicare Other

## 2022-07-01 ENCOUNTER — Encounter (HOSPITAL_COMMUNITY)
Admission: EM | Disposition: A | Discharge status: Skilled Nursing Facility | Payer: Self-pay | Source: Skilled Nursing Facility | Attending: Internal Medicine

## 2022-07-01 ENCOUNTER — Encounter (HOSPITAL_COMMUNITY): Payer: Self-pay | Admitting: Internal Medicine

## 2022-07-01 DIAGNOSIS — N3281 Overactive bladder: Secondary | ICD-10-CM | POA: Insufficient documentation

## 2022-07-01 DIAGNOSIS — F0283 Dementia in other diseases classified elsewhere, unspecified severity, with mood disturbance: Secondary | ICD-10-CM | POA: Diagnosis present

## 2022-07-01 DIAGNOSIS — K219 Gastro-esophageal reflux disease without esophagitis: Secondary | ICD-10-CM | POA: Diagnosis present

## 2022-07-01 DIAGNOSIS — S72009A Fracture of unspecified part of neck of unspecified femur, initial encounter for closed fracture: Secondary | ICD-10-CM | POA: Diagnosis present

## 2022-07-01 DIAGNOSIS — H9190 Unspecified hearing loss, unspecified ear: Secondary | ICD-10-CM | POA: Diagnosis present

## 2022-07-01 DIAGNOSIS — E785 Hyperlipidemia, unspecified: Secondary | ICD-10-CM | POA: Diagnosis present

## 2022-07-01 DIAGNOSIS — F39 Unspecified mood [affective] disorder: Secondary | ICD-10-CM | POA: Insufficient documentation

## 2022-07-01 DIAGNOSIS — E039 Hypothyroidism, unspecified: Secondary | ICD-10-CM | POA: Diagnosis present

## 2022-07-01 DIAGNOSIS — F02811 Dementia in other diseases classified elsewhere, unspecified severity, with agitation: Secondary | ICD-10-CM | POA: Diagnosis present

## 2022-07-01 DIAGNOSIS — Z7982 Long term (current) use of aspirin: Secondary | ICD-10-CM | POA: Diagnosis not present

## 2022-07-01 DIAGNOSIS — Z79890 Hormone replacement therapy (postmenopausal): Secondary | ICD-10-CM | POA: Diagnosis not present

## 2022-07-01 DIAGNOSIS — F0284 Dementia in other diseases classified elsewhere, unspecified severity, with anxiety: Secondary | ICD-10-CM | POA: Diagnosis present

## 2022-07-01 DIAGNOSIS — S72001A Fracture of unspecified part of neck of right femur, initial encounter for closed fracture: Secondary | ICD-10-CM

## 2022-07-01 DIAGNOSIS — Z8673 Personal history of transient ischemic attack (TIA), and cerebral infarction without residual deficits: Secondary | ICD-10-CM | POA: Diagnosis not present

## 2022-07-01 DIAGNOSIS — F02B3 Dementia in other diseases classified elsewhere, moderate, with mood disturbance: Secondary | ICD-10-CM | POA: Diagnosis not present

## 2022-07-01 DIAGNOSIS — Y92099 Unspecified place in other non-institutional residence as the place of occurrence of the external cause: Secondary | ICD-10-CM | POA: Diagnosis not present

## 2022-07-01 DIAGNOSIS — E02 Subclinical iodine-deficiency hypothyroidism: Secondary | ICD-10-CM | POA: Diagnosis not present

## 2022-07-01 DIAGNOSIS — Z79899 Other long term (current) drug therapy: Secondary | ICD-10-CM | POA: Diagnosis not present

## 2022-07-01 DIAGNOSIS — S72141A Displaced intertrochanteric fracture of right femur, initial encounter for closed fracture: Secondary | ICD-10-CM

## 2022-07-01 DIAGNOSIS — Z888 Allergy status to other drugs, medicaments and biological substances status: Secondary | ICD-10-CM | POA: Diagnosis not present

## 2022-07-01 DIAGNOSIS — Z66 Do not resuscitate: Secondary | ICD-10-CM | POA: Diagnosis present

## 2022-07-01 DIAGNOSIS — F0390 Unspecified dementia without behavioral disturbance: Secondary | ICD-10-CM | POA: Insufficient documentation

## 2022-07-01 DIAGNOSIS — F02818 Dementia in other diseases classified elsewhere, unspecified severity, with other behavioral disturbance: Secondary | ICD-10-CM | POA: Diagnosis present

## 2022-07-01 DIAGNOSIS — F03911 Unspecified dementia, unspecified severity, with agitation: Secondary | ICD-10-CM

## 2022-07-01 DIAGNOSIS — I10 Essential (primary) hypertension: Secondary | ICD-10-CM | POA: Diagnosis present

## 2022-07-01 DIAGNOSIS — F01511 Vascular dementia, unspecified severity, with agitation: Secondary | ICD-10-CM | POA: Diagnosis not present

## 2022-07-01 DIAGNOSIS — W010XXA Fall on same level from slipping, tripping and stumbling without subsequent striking against object, initial encounter: Secondary | ICD-10-CM | POA: Diagnosis present

## 2022-07-01 HISTORY — PX: INTRAMEDULLARY (IM) NAIL INTERTROCHANTERIC: SHX5875

## 2022-07-01 LAB — COMPREHENSIVE METABOLIC PANEL
ALT: 16 U/L (ref 0–44)
AST: 24 U/L (ref 15–41)
Albumin: 2.7 g/dL — ABNORMAL LOW (ref 3.5–5.0)
Alkaline Phosphatase: 78 U/L (ref 38–126)
Anion gap: 13 (ref 5–15)
BUN: 22 mg/dL (ref 8–23)
CO2: 26 mmol/L (ref 22–32)
Calcium: 8.3 mg/dL — ABNORMAL LOW (ref 8.9–10.3)
Chloride: 99 mmol/L (ref 98–111)
Creatinine, Ser: 0.82 mg/dL (ref 0.44–1.00)
GFR, Estimated: >60 mL/min (ref >60)
Glucose, Bld: 161 mg/dL — ABNORMAL HIGH (ref 70–99)
Potassium: 3.9 mmol/L (ref 3.5–5.1)
Sodium: 138 mmol/L (ref 135–145)
Total Bilirubin: 0.5 mg/dL (ref 0.3–1.2)
Total Protein: 6.7 g/dL (ref 6.5–8.1)

## 2022-07-01 LAB — CBC
HCT: 36.9 % (ref 36.0–46.0)
Hemoglobin: 11.9 g/dL — ABNORMAL LOW (ref 12.0–15.0)
MCH: 30.3 pg (ref 26.0–34.0)
MCHC: 32.2 g/dL (ref 30.0–36.0)
MCV: 93.9 fL (ref 80.0–100.0)
Platelets: 196 10*3/uL (ref 150–400)
RBC: 3.93 MIL/uL (ref 3.87–5.11)
RDW: 14 % (ref 11.5–15.5)
WBC: 10 10*3/uL (ref 4.0–10.5)
nRBC: 0 % (ref 0.0–0.2)

## 2022-07-01 LAB — TYPE AND SCREEN
ABO/RH(D): B POS
Antibody Screen: NEGATIVE

## 2022-07-01 LAB — BRAIN NATRIURETIC PEPTIDE: B Natriuretic Peptide: 146.8 pg/mL — ABNORMAL HIGH (ref 0.0–100.0)

## 2022-07-01 SURGERY — FIXATION, FRACTURE, INTERTROCHANTERIC, WITH INTRAMEDULLARY ROD
Anesthesia: General | Site: Hip | Laterality: Right

## 2022-07-01 MED ORDER — NALOXONE HCL 0.4 MG/ML IJ SOLN: 0.4000 mg | INTRAMUSCULAR | Status: DC | PRN

## 2022-07-01 MED ORDER — FENTANYL CITRATE (PF) 250 MCG/5ML IJ SOLN
INTRAMUSCULAR | Status: AC
  Filled 2022-07-01: qty 5

## 2022-07-01 MED ORDER — CEFAZOLIN SODIUM-DEXTROSE 2-4 GM/100ML-% IV SOLN
INTRAVENOUS | Status: AC
  Filled 2022-07-01: qty 100

## 2022-07-01 MED ORDER — ACETAMINOPHEN 10 MG/ML IV SOLN: 1000.0000 mg | Freq: Once | INTRAVENOUS | Status: DC | PRN

## 2022-07-01 MED ORDER — ONDANSETRON HCL 4 MG/2ML IJ SOLN
INTRAMUSCULAR | Status: DC | PRN
  Administered 2022-07-01: 4 mg via INTRAVENOUS

## 2022-07-01 MED ORDER — FENTANYL CITRATE (PF) 100 MCG/2ML IJ SOLN
25.0000 ug | INTRAMUSCULAR | Status: DC | PRN
  Administered 2022-07-01: 25 ug via INTRAVENOUS

## 2022-07-01 MED ORDER — FESOTERODINE FUMARATE ER 4 MG PO TB24
4.0000 mg | ORAL_TABLET | Freq: Every day | ORAL | Status: DC
  Administered 2022-07-01 – 2022-07-05 (×5): 4 mg via ORAL
  Filled 2022-07-01 (×5): qty 1

## 2022-07-01 MED ORDER — ENOXAPARIN SODIUM 30 MG/0.3ML IJ SOSY
30.0000 mg | PREFILLED_SYRINGE | INTRAMUSCULAR | Status: DC
  Administered 2022-07-02 – 2022-07-05 (×4): 30 mg via SUBCUTANEOUS
  Filled 2022-07-01 (×4): qty 0.3

## 2022-07-01 MED ORDER — EPHEDRINE SULFATE-NACL 50-0.9 MG/10ML-% IV SOSY
PREFILLED_SYRINGE | INTRAVENOUS | Status: DC | PRN
  Administered 2022-07-01: 5 mg via INTRAVENOUS

## 2022-07-01 MED ORDER — DEXAMETHASONE SODIUM PHOSPHATE 10 MG/ML IJ SOLN
INTRAMUSCULAR | Status: AC
  Filled 2022-07-01: qty 1

## 2022-07-01 MED ORDER — MORPHINE SULFATE (PF) 2 MG/ML IV SOLN
1.0000 mg | INTRAVENOUS | Status: DC | PRN
  Administered 2022-07-01 – 2022-07-03 (×4): 1 mg via INTRAVENOUS
  Filled 2022-07-01 (×4): qty 1

## 2022-07-01 MED ORDER — OXYCODONE HCL 5 MG PO TABS
2.5000 mg | ORAL_TABLET | Freq: Four times a day (QID) | ORAL | Status: DC | PRN
  Administered 2022-07-02 – 2022-07-03 (×2): 5 mg via ORAL
  Filled 2022-07-01 (×2): qty 1

## 2022-07-01 MED ORDER — ROCURONIUM BROMIDE 10 MG/ML (PF) SYRINGE
PREFILLED_SYRINGE | INTRAVENOUS | Status: DC | PRN
  Administered 2022-07-01: 50 mg via INTRAVENOUS

## 2022-07-01 MED ORDER — ONDANSETRON HCL 4 MG/2ML IJ SOLN
INTRAMUSCULAR | Status: AC
  Filled 2022-07-01: qty 2

## 2022-07-01 MED ORDER — 0.9 % SODIUM CHLORIDE (POUR BTL) OPTIME
TOPICAL | Status: DC | PRN
  Administered 2022-07-01: 1000 mL

## 2022-07-01 MED ORDER — SUGAMMADEX SODIUM 200 MG/2ML IV SOLN
INTRAVENOUS | Status: DC | PRN
  Administered 2022-07-01: 200 mg via INTRAVENOUS

## 2022-07-01 MED ORDER — ORAL CARE MOUTH RINSE: 15.0000 mL | Freq: Once | OROMUCOSAL | Status: DC

## 2022-07-01 MED ORDER — OXYCODONE HCL 5 MG/5ML PO SOLN: 5.0000 mg | Freq: Once | ORAL | Status: DC | PRN

## 2022-07-01 MED ORDER — CHLORHEXIDINE GLUCONATE 0.12 % MT SOLN: 15.0000 mL | Freq: Once | OROMUCOSAL | Status: DC

## 2022-07-01 MED ORDER — PROPOFOL 10 MG/ML IV BOLUS
INTRAVENOUS | Status: DC | PRN
  Administered 2022-07-01: 50 mg via INTRAVENOUS

## 2022-07-01 MED ORDER — FENTANYL CITRATE (PF) 250 MCG/5ML IJ SOLN
INTRAMUSCULAR | Status: DC | PRN
  Administered 2022-07-01 (×3): 50 ug via INTRAVENOUS

## 2022-07-01 MED ORDER — PROPOFOL 10 MG/ML IV BOLUS
INTRAVENOUS | Status: AC
  Filled 2022-07-01: qty 20

## 2022-07-01 MED ORDER — CEFAZOLIN SODIUM-DEXTROSE 2-4 GM/100ML-% IV SOLN
2.0000 g | Freq: Two times a day (BID) | INTRAVENOUS | Status: AC
  Administered 2022-07-02 (×2): 2 g via INTRAVENOUS
  Filled 2022-07-01 (×2): qty 100

## 2022-07-01 MED ORDER — PHENYLEPHRINE 80 MCG/ML (10ML) SYRINGE FOR IV PUSH (FOR BLOOD PRESSURE SUPPORT)
PREFILLED_SYRINGE | INTRAVENOUS | Status: DC | PRN
  Administered 2022-07-01: 120 ug via INTRAVENOUS
  Administered 2022-07-01: 80 ug via INTRAVENOUS

## 2022-07-01 MED ORDER — LACTATED RINGERS IV SOLN: INTRAVENOUS | Status: DC

## 2022-07-01 MED ORDER — LEVOTHYROXINE SODIUM 50 MCG PO TABS
50.0000 ug | ORAL_TABLET | Freq: Every day | ORAL | Status: DC
  Administered 2022-07-01 – 2022-07-05 (×5): 50 ug via ORAL
  Filled 2022-07-01 (×5): qty 1

## 2022-07-01 MED ORDER — CEFAZOLIN SODIUM-DEXTROSE 2-3 GM-%(50ML) IV SOLR
INTRAVENOUS | Status: DC | PRN
  Administered 2022-07-01: 2 g via INTRAVENOUS

## 2022-07-01 MED ORDER — FENTANYL CITRATE (PF) 100 MCG/2ML IJ SOLN
INTRAMUSCULAR | Status: AC
  Filled 2022-07-01: qty 2

## 2022-07-01 MED ORDER — DIVALPROEX SODIUM 125 MG PO DR TAB
125.0000 mg | DELAYED_RELEASE_TABLET | Freq: Two times a day (BID) | ORAL | Status: DC
  Administered 2022-07-01 – 2022-07-05 (×10): 125 mg via ORAL
  Filled 2022-07-01 (×11): qty 1

## 2022-07-01 MED ORDER — DEXAMETHASONE SODIUM PHOSPHATE 10 MG/ML IJ SOLN
INTRAMUSCULAR | Status: DC | PRN
  Administered 2022-07-01: 10 mg via INTRAVENOUS

## 2022-07-01 MED ORDER — HYDRALAZINE HCL 20 MG/ML IJ SOLN
5.0000 mg | Freq: Four times a day (QID) | INTRAMUSCULAR | Status: DC | PRN
  Filled 2022-07-01: qty 1

## 2022-07-01 MED ORDER — SODIUM CHLORIDE 0.9 % IV SOLN: INTRAVENOUS | Status: AC

## 2022-07-01 MED ORDER — PROMETHAZINE HCL 25 MG/ML IJ SOLN: 6.2500 mg | INTRAMUSCULAR | Status: DC | PRN

## 2022-07-01 MED ORDER — FAMOTIDINE 20 MG PO TABS
20.0000 mg | ORAL_TABLET | Freq: Every day | ORAL | Status: DC
  Administered 2022-07-01 – 2022-07-05 (×5): 20 mg via ORAL
  Filled 2022-07-01 (×5): qty 1

## 2022-07-01 MED ORDER — OXYCODONE HCL 5 MG PO TABS: 5.0000 mg | ORAL_TABLET | Freq: Once | ORAL | Status: DC | PRN

## 2022-07-01 MED ORDER — ATORVASTATIN CALCIUM 10 MG PO TABS
10.0000 mg | ORAL_TABLET | Freq: Every day | ORAL | Status: DC
  Administered 2022-07-01 – 2022-07-05 (×5): 10 mg via ORAL
  Filled 2022-07-01 (×5): qty 1

## 2022-07-01 MED ORDER — LIDOCAINE 2% (20 MG/ML) 5 ML SYRINGE
INTRAMUSCULAR | Status: DC | PRN
  Administered 2022-07-01: 60 mg via INTRAVENOUS
  Administered 2022-07-01: 40 mg via INTRAVENOUS

## 2022-07-01 MED ORDER — ACETAMINOPHEN 325 MG PO TABS
650.0000 mg | ORAL_TABLET | Freq: Four times a day (QID) | ORAL | Status: DC
  Administered 2022-07-02 – 2022-07-05 (×11): 650 mg via ORAL
  Filled 2022-07-01 (×14): qty 2

## 2022-07-01 SURGICAL SUPPLY — 50 items
BIT DRILL CANN LG 4.3MM (BIT) IMPLANT
BIT DRILL LAG SCREW (DRILL) IMPLANT
BNDG COHESIVE 6X5 TAN STRL LF (GAUZE/BANDAGES/DRESSINGS) IMPLANT
BRUSH SCRUB EZ PLAIN DRY (MISCELLANEOUS) ×2 IMPLANT
COVER PERINEAL POST (MISCELLANEOUS) ×1 IMPLANT
COVER SURGICAL LIGHT HANDLE (MISCELLANEOUS) ×2 IMPLANT
DRAPE C-ARM 42X72 X-RAY (DRAPES) ×1 IMPLANT
DRAPE C-ARMOR (DRAPES) ×1 IMPLANT
DRAPE HALF SHEET 40X57 (DRAPES) IMPLANT
DRAPE INCISE IOBAN 66X45 STRL (DRAPES) IMPLANT
DRAPE ORTHO SPLIT 77X108 STRL (DRAPES) ×2
DRAPE SURG ORHT 6 SPLT 77X108 (DRAPES) IMPLANT
DRESSING MEPILEX FLEX 4X4 (GAUZE/BANDAGES/DRESSINGS) IMPLANT
DRILL BIT CANN LG 4.3MM (BIT) ×1
DRILL LAG SCREW (DRILL) ×1
DRSG EMULSION OIL 3X3 NADH (GAUZE/BANDAGES/DRESSINGS) ×1 IMPLANT
DRSG MEPILEX BORDER 4X4 (GAUZE/BANDAGES/DRESSINGS) ×1 IMPLANT
DRSG MEPILEX BORDER 4X8 (GAUZE/BANDAGES/DRESSINGS) ×1 IMPLANT
DRSG MEPILEX FLEX 4X4 (GAUZE/BANDAGES/DRESSINGS) ×3
ELECT REM PT RETURN 9FT ADLT (ELECTROSURGICAL) ×1
ELECTRODE REM PT RTRN 9FT ADLT (ELECTROSURGICAL) ×1 IMPLANT
GLOVE BIO SURGEON STRL SZ7.5 (GLOVE) ×1 IMPLANT
GLOVE BIO SURGEON STRL SZ8 (GLOVE) ×1 IMPLANT
GLOVE BIOGEL PI IND STRL 7.5 (GLOVE) ×1 IMPLANT
GLOVE BIOGEL PI IND STRL 8 (GLOVE) ×1 IMPLANT
GLOVE SURG ORTHO LTX SZ7.5 (GLOVE) ×2 IMPLANT
GOWN STRL REUS W/ TWL LRG LVL3 (GOWN DISPOSABLE) ×2 IMPLANT
GOWN STRL REUS W/ TWL XL LVL3 (GOWN DISPOSABLE) ×1 IMPLANT
GOWN STRL REUS W/TWL LRG LVL3 (GOWN DISPOSABLE) ×2
GOWN STRL REUS W/TWL XL LVL3 (GOWN DISPOSABLE) ×1
GUIDEPIN VERSANAIL DSP 3.2X444 (ORTHOPEDIC DISPOSABLE SUPPLIES) IMPLANT
HIP FRA NAIL LAG SCREW 10.5X90 (Orthopedic Implant) ×1 IMPLANT
KIT BASIN OR (CUSTOM PROCEDURE TRAY) ×1 IMPLANT
KIT TURNOVER KIT B (KITS) ×1 IMPLANT
NAIL HIP FRACT 130D 11X180 (Screw) IMPLANT
NS IRRIG 1000ML POUR BTL (IV SOLUTION) ×1 IMPLANT
PACK GENERAL/GYN (CUSTOM PROCEDURE TRAY) ×1 IMPLANT
PAD ARMBOARD 7.5X6 YLW CONV (MISCELLANEOUS) ×2 IMPLANT
SCREW BONE CORTICAL 5.0X32 (Screw) IMPLANT
SCREW LAG HIP FRA NAIL 10.5X90 (Orthopedic Implant) IMPLANT
STAPLER VISISTAT 35W (STAPLE) ×1 IMPLANT
SUT ETHILON 2 0 PSLX (SUTURE) ×1 IMPLANT
SUT VIC AB 0 CT1 27 (SUTURE) ×1
SUT VIC AB 0 CT1 27XBRD ANBCTR (SUTURE) ×1 IMPLANT
SUT VIC AB 1 CT1 27 (SUTURE) ×1
SUT VIC AB 1 CT1 27XBRD ANBCTR (SUTURE) ×1 IMPLANT
SUT VIC AB 2-0 CT1 27 (SUTURE) ×1
SUT VIC AB 2-0 CT1 TAPERPNT 27 (SUTURE) ×1 IMPLANT
TOWEL GREEN STERILE (TOWEL DISPOSABLE) ×2 IMPLANT
TOWEL GREEN STERILE FF (TOWEL DISPOSABLE) ×1 IMPLANT

## 2022-07-01 NOTE — Op Note (Addendum)
07/01/2022  2:51 PM  PATIENT:  [name]  02/09/1924 female   MEDICAL RECORD NUMBER: [id number]  PRE-OPERATIVE DIAGNOSIS:  RIGHT HIP FRACTURE  POST-OPERATIVE DIAGNOSIS:  RIGHT HIP FRACTURE  PROCEDURE:  INTRAMEDULLARY NAILING OF THE RIGHT HIP using a Biomet Affixus nail 11 mm diameter locked short.  SURGEON:  Astrid Divine. Marcelino Scot, M.D.  ASSISTANT:  Wandra Arthurs, RN-FA.  ANESTHESIA:  General.  COMPLICATIONS:  None.  ESTIMATED BLOOD LOSS:  Less than 150 mL.  DISPOSITION:  To PACU.  CONDITION:  Stable.  DELAY START OF DVT PROPHYLAXIS BECAUSE OF BLEEDING RISK: NO  BRIEF SUMMARY AND INDICATION OF PROCEDURE:  [name] is a 89 y.o. year- old with multiple medical problems.  The risks and benefits of surgical treatment were discussed with the family including the potential for death, malunion, nonunion, symptomatic hardware, heart attack, stroke, neurovascular injury, bleeding, and others. These risks were acknowledged and consent provided to proceed.   BRIEF SUMMARY OF PROCEDURE:  The patient was taken to the operating room where general anesthesia was induced.  She was positioned supine on the Hana fracture table.  A closed reduction maneuver was performed of the fractured proximal femur and this was confirmed on both AP and lateral xray views. A thorough scrub and wash with chlorhexidine and then Betadine scrub and paint was performed.  After sterile drapes and time-out, a long instrument was used to identify the appropriate starting position under C-arm on both AP and lateral images.  A 3 cm incision was made proximal to the greater trochanter.  The curved cannulated awl was inserted just medial to the tip of the lateral trochanter and then the starting guidewire advanced into the proximal femur.  This was checked on AP and lateral views.  The starting reamer was engaged with the soft tissue protected by a sleeve.  The 11 mm short nail was then inserted to the appropriate depth.   The guidewire for the lag screw was then inserted with the appropriate anteversion to make sure it was in a center-center position.  This was measured and the lag screw placed with excellent purchase and position checked on both views. The set screw was then engaged within the groove of the lag screw, which was allowed to telescope.  Traction was released and compression achieved with the screw.  This was followed by placement of one distal locking screw using the jig.  This was confirmed on AP and lateral images. Wounds were irrigated thoroughly, closed in a standard layered fashion. Sterile gently compressive dressings were applied.  Wandra Arthurs RNFA assisted throughout.  The patient was awakened from anesthesia and transported to the PACU in stable condition.  PROGNOSIS:  The patient will be weightbearing as tolerated with physical therapy beginning DVT prophylaxis with Lovenox.  There are no range of motion precautions.  We will continue to follow while in the hospital.  Anticipate follow up in the office in 2 weeks for removal of sutures and further evaluation.     Astrid Divine. Marcelino Scot, M.D.

## 2022-07-01 NOTE — Progress Notes (Signed)
PHARMACY NOTE:  ANTIMICROBIAL RENAL DOSAGE ADJUSTMENT  Current antimicrobial regimen includes a mismatch between antimicrobial dosage and estimated renal function.  As per policy approved by the Pharmacy & Therapeutics and Medical Executive Committees, the antimicrobial dosage will be adjusted accordingly.  Current antimicrobial dosage:  Cefazolin 2g IV every 8 hours   Indication: Surgical prophylaxis 24 hour post op  Renal Function: Estimated Creatinine Clearance: 28.8 mL/min (by C-G formula based on SCr of 0.82 mg/dL). []      On intermittent HD, scheduled: []      On CRRT    Antimicrobial dosage has been changed to:  Cefazolin 2g IV every 12 hours x 2 doses to complete post op 24hr coverage.     Thank you for allowing pharmacy to be a part of this patient's care.  Nicole Cella, RPh Clinical Pharmacist  07/01/2022 6:39 PM
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
## 2022-07-01 NOTE — ED Notes (Signed)
Contacted [facility] and gave report to [place], Therapist, sports. Contacted [facility] to have pt transported.
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
## 2022-07-01 NOTE — ED Notes (Signed)
Patient is sleeping.  She remains on cardiac monitoring.  IV remains intact.  Purewick in place.  Patient has noted shortening and outward rotation of the right leg.  Edema noted to bil lower extremities at the ankle and foot.  Pedal pulses remains strong bil.

## 2022-07-01 NOTE — Progress Notes (Signed)
Received patient from PACU via bed alert and oriented to self.  Assisted in position of comfort, denies pain at this time.  Daughter Almyra Free at bedside.  Both oriented to room and unit routine, needs addressed.  Call bell within reach.

## 2022-07-01 NOTE — Progress Notes (Addendum)
ANTICOAGULATION CONSULT NOTE - Initial Consult  Pharmacy Consult for Lovenox Indication: VTE prophylaxis  Allergies  Allergen Reactions   Chlorthalidone Nausea And Vomiting   Metoprolol Succinate Other (See Comments)    Reaction unconfirmed    Patient Measurements: Height: 5' (152.4 cm) Weight: 51 kg (112 lb 7 oz) IBW/kg (Calculated) : 45.5  Labs: Recent Labs    06/30/22 2214 07/01/22 0500  HGB 12.9 11.9*  HCT 40.1 36.9  PLT 219 196  LABPROT 14.4  --   INR 1.1  --   CREATININE 0.87 0.82    Estimated Creatinine Clearance: 28.8 mL/min (by C-G formula based on SCr of 0.82 mg/dL).   Medical History: Past Medical History:  Diagnosis Date   Dementia ([place])    Hypertension    Osteoarthritis    Spondylosis of cervical spine    Toxic encephalopathy    UTI (urinary tract infection)     Assessment: 89 y.o female fell at SNF>found to have right hip fracture.   Today she is s/p intramedulllary nailing of right femur.  Pharmacy consulted to dose Lovenox for VTE prophylaxis.   Not on anticoagulation PTA.  Weight is 51 kg ,SCr 0.82, CrCl 28 ml/min. Preop Hgb 12.9 >11.9, pltc 219>196k.  Due to CrCl < 30 ml/min, will dose lovenox at 30mg  q24h.    Goal of Therapy:  VTE prophylaxis Monitor platelets by anticoagulation protocol: Yes   Plan:  Lovenox 30 mg SQ every 24 hours , start on 2/1 at 0800 POD#1.  I recommend to follow CBC and monitor for s/sx of bleeding.  Pharmacy will sign off.  Thank you for allowing pharmacy to be part of this patients care team.  Nicole Cella, [place] Pharmacist 07/01/2022,6:22 PM  Please check AMION for all [place] phone numbers After 10:00 PM, call [place]

## 2022-07-01 NOTE — Transfer of Care (Signed)
Immediate Anesthesia Transfer of Care Note  Patient: [name]  Procedure(s) Performed: INTRAMEDULLARY (IM) NAIL INTERTROCHANTERIC RIGHT FEMUR (Right: Hip)  Patient Location: PACU  Anesthesia Type:General  Level of Consciousness: awake, alert , and oriented  Airway & Oxygen Therapy: Patient Spontanous Breathing and Patient connected to nasal cannula oxygen  Post-op Assessment: Report given to RN and Post -op Vital signs reviewed and stable  Post vital signs: Reviewed and stable  Last Vitals:  Vitals Value Taken Time  BP 172/71 07/01/22 1638  Temp 36.2 C 07/01/22 1638  Pulse 84 07/01/22 1643  Resp 11 07/01/22 1643  SpO2 98 % 07/01/22 1643  Vitals shown include unvalidated device data.  Last Pain:  Vitals:   07/01/22 1305  TempSrc:   PainSc: Asleep         Complications: No notable events documented.

## 2022-07-01 NOTE — Anesthesia Preprocedure Evaluation (Addendum)
Anesthesia Evaluation  Patient identified by MRN, date of birth, ID bandGeneral Assessment Comment:Awake, hoh  Reviewed: Allergy & Precautions, NPO status , Patient's Chart, lab work & pertinent test results, Unable to perform ROS - Chart review only  History of Anesthesia Complications Negative for: history of anesthetic complications  Airway Mallampati: III  TM Distance: >3 FB Neck ROM: Full    Dental   Pulmonary neg pulmonary ROS   breath sounds clear to auscultation       Cardiovascular hypertension, + dysrhythmias (palpitations)  Rhythm:Regular  HLD   Neuro/Psych  PSYCHIATRIC DISORDERS     Dementia    GI/Hepatic ,GERD  Medicated,,  Endo/Other  Hypothyroidism    Renal/GU  Bladder dysfunction      Musculoskeletal  (+) Arthritis , Osteoarthritis,  RIGHT HIP FRACTURE   Abdominal   Peds  Hematology   Anesthesia Other Findings   Reproductive/Obstetrics                             Anesthesia Physical Anesthesia Plan  ASA: 3  Anesthesia Plan: General   Post-op Pain Management:    Induction: Intravenous  PONV Risk Score and Plan: 3 and Ondansetron and Dexamethasone  Airway Management Planned: Oral ETT  Additional Equipment: None  Intra-op Plan:   Post-operative Plan: Extubation in OR  Informed Consent:   Plan Discussed with: CRNA  Anesthesia Plan Comments:        Anesthesia Quick Evaluation

## 2022-07-01 NOTE — Consult Note (Signed)
Reason for Consult:Right hip fx Referring Physician: Barb Merino Time called: [id number] Time at bedside: [place]   [name] is an 89 y.o. female.  HPI: Shaletha fell at the SNF where she resides. She was brought to the ED where x-rays showed a right hip fx. She was transferred to [facility] and orthopedic surgery was consulted. Pt is demented and cannot contribute meaningfully to history.  Past Medical History:  Diagnosis Date   Dementia ([place])    Hypertension    Osteoarthritis    Spondylosis of cervical spine    Toxic encephalopathy    UTI (urinary tract infection)     History reviewed. No pertinent surgical history.  History reviewed. No pertinent family history.  Social History:  reports that she has never smoked. She has never used smokeless tobacco. She reports that she does not currently use alcohol. She reports that she does not use drugs.  Allergies:  Allergies  Allergen Reactions   Chlorthalidone Nausea And Vomiting   Metoprolol Succinate Other (See Comments)    Reaction unconfirmed    Medications: I have reviewed the patient's current medications.  Results for orders placed or performed during the hospital encounter of 06/30/22 (from the past 48 hour(s))  Basic metabolic panel     Status: Abnormal   Collection Time: 06/30/22 10:14 PM  Result Value Ref Range   Sodium 137 135 - 145 mmol/L   Potassium 3.9 3.5 - 5.1 mmol/L   Chloride 95 (L) 98 - 111 mmol/L   CO2 29 22 - 32 mmol/L   Glucose, Bld 148 (H) 70 - 99 mg/dL    Comment: Glucose reference range applies only to samples taken after fasting for at least 8 hours.   BUN 24 (H) 8 - 23 mg/dL   Creatinine, Ser 0.87 0.44 - 1.00 mg/dL   Calcium 8.4 (L) 8.9 - 10.3 mg/dL   GFR, Estimated >60 >60 mL/min    Comment: (NOTE) Calculated using the CKD-EPI Creatinine Equation (2021)    Anion gap 13 5 - 15    Comment: Performed at [facility], [place] [address], [place], [place] [address]  CBC with Differential      Status: Abnormal   Collection Time: 06/30/22 10:14 PM  Result Value Ref Range   WBC 10.5 4.0 - 10.5 K/uL   RBC 4.36 3.87 - 5.11 MIL/uL   Hemoglobin 12.9 12.0 - 15.0 g/dL   HCT 40.1 36.0 - 46.0 %   MCV 92.0 80.0 - 100.0 fL   MCH 29.6 26.0 - 34.0 pg   MCHC 32.2 30.0 - 36.0 g/dL   RDW 14.0 11.5 - 15.5 %   Platelets 219 150 - 400 K/uL   nRBC 0.0 0.0 - 0.2 %   Neutrophils Relative % 87 %   Neutro Abs 9.0 (H) 1.7 - 7.7 K/uL   Lymphocytes Relative 6 %   Lymphs Abs 0.6 (L) 0.7 - 4.0 K/uL   Monocytes Relative 6 %   Monocytes Absolute 0.7 0.1 - 1.0 K/uL   Eosinophils Relative 0 %   Eosinophils Absolute 0.0 0.0 - 0.5 K/uL   Basophils Relative 0 %   Basophils Absolute 0.0 0.0 - 0.1 K/uL   Immature Granulocytes 1 %   Abs Immature Granulocytes 0.06 0.00 - 0.07 K/uL    Comment: Performed at [facility], [place] [address], [place], [place] [address]  Protime-INR     Status: None   Collection Time: 06/30/22 10:14 PM  Result Value Ref Range  Prothrombin Time 14.4 11.4 - 15.2 seconds   INR 1.1 0.8 - 1.2    Comment: (NOTE) INR goal varies based on device and disease states. Performed at [facility], [place] [address] [place], [place] [address]   Type and screen [place]     Status: None   Collection Time: 06/30/22 10:20 PM  Result Value Ref Range   ABO/RH(D) B POS    Antibody Screen NEG    Sample Expiration      07/03/2022,2359 Performed at [facility], [place] [address], [place], [place] [address]   ABO/Rh     Status: None   Collection Time: 06/30/22 10:32 PM  Result Value Ref Range   ABO/RH(D)      B POS Performed at [facility], [place] [address] [place], [place] [address]   CBC     Status: Abnormal   Collection Time: 07/01/22  5:00 AM  Result Value Ref Range   WBC 10.0 4.0 - 10.5 K/uL   RBC 3.93 3.87 - 5.11 MIL/uL   Hemoglobin 11.9 (L) 12.0 - 15.0 g/dL   HCT 36.9 36.0 - 46.0 %    MCV 93.9 80.0 - 100.0 fL   MCH 30.3 26.0 - 34.0 pg   MCHC 32.2 30.0 - 36.0 g/dL   RDW 14.0 11.5 - 15.5 %   Platelets 196 150 - 400 K/uL   nRBC 0.0 0.0 - 0.2 %    Comment: Performed at [facility], [place] [address], [place], [place] [address]  Comprehensive metabolic panel     Status: Abnormal   Collection Time: 07/01/22  5:00 AM  Result Value Ref Range   Sodium 138 135 - 145 mmol/L   Potassium 3.9 3.5 - 5.1 mmol/L   Chloride 99 98 - 111 mmol/L   CO2 26 22 - 32 mmol/L   Glucose, Bld 161 (H) 70 - 99 mg/dL    Comment: Glucose reference range applies only to samples taken after fasting for at least 8 hours.   BUN 22 8 - 23 mg/dL   Creatinine, Ser 0.82 0.44 - 1.00 mg/dL   Calcium 8.3 (L) 8.9 - 10.3 mg/dL   Total Protein 6.7 6.5 - 8.1 g/dL   Albumin 2.7 (L) 3.5 - 5.0 g/dL   AST 24 15 - 41 U/L   ALT 16 0 - 44 U/L   Alkaline Phosphatase 78 38 - 126 U/L   Total Bilirubin 0.5 0.3 - 1.2 mg/dL   GFR, Estimated >60 >60 mL/min    Comment: (NOTE) Calculated using the CKD-EPI Creatinine Equation (2021)    Anion gap 13 5 - 15    Comment: Performed at [facility], [place] [address] [place], [place] [address]  Brain natriuretic peptide     Status: Abnormal   Collection Time: 07/01/22  5:00 AM  Result Value Ref Range   B Natriuretic Peptide 146.8 (H) 0.0 - 100.0 pg/mL    Comment: Performed at [facility], [place] [address], [place], [place] [address]    DG Chest Portable 1 View  Result Date: 06/30/2022 CLINICAL DATA:  Preop for hip fracture EXAM: PORTABLE CHEST 1 VIEW COMPARISON:  None Available. FINDINGS: Airspace opacities in the right mid/lower lung are favored atelectasis/scarring. Otherwise no focal consolidation. No pleural effusion or pneumothorax. Normal cardiomediastinal silhouette. Aortic atherosclerotic calcification. No acute fracture. Degenerative arthritis both shoulders. IMPRESSION: Presumed scarring/atelectasis in the right  mid/lower lung. Electronically Signed  By: Placido Sou M.D.   On: 06/30/2022 23:09   CT HEAD WO CONTRAST (5MM)  Result Date: 06/30/2022 CLINICAL DATA:  Fall, head and neck trauma. EXAM: CT HEAD WITHOUT CONTRAST CT CERVICAL SPINE WITHOUT CONTRAST TECHNIQUE: Multidetector CT imaging of the head and cervical spine was performed following the standard protocol without intravenous contrast. Multiplanar CT image reconstructions of the cervical spine were also generated. RADIATION DOSE REDUCTION: This exam was performed according to the departmental dose-optimization program which includes automated exposure control, adjustment of the mA and/or kV according to patient size and/or use of iterative reconstruction technique. COMPARISON:  06/22/2022. FINDINGS: CT HEAD FINDINGS Brain: No acute intracranial hemorrhage, midline shift or mass effect. No extra-axial fluid collection. Diffuse atrophy is noted. Subcortical and periventricular white matter hypodensities are present bilaterally. No hydrocephalus. Vascular: No hyperdense vessel or unexpected calcification. Skull: No acute fracture. Sinuses/Orbits: Minimal mucosal thickening in the right sphenoid sinus. No acute orbital abnormality Other: Near complete opacification of the mastoid air cells and middle ear on the right. CT CERVICAL SPINE FINDINGS Alignment: Normal. Skull base and vertebrae: No acute fracture. There is partial fusion at C4-C5. Soft tissues and spinal canal: No prevertebral fluid or swelling. No visible canal hematoma. Disc levels: Multilevel intervertebral disc space narrowing, disc osteophyte formation, and facet arthropathy. Upper chest: Biapical pleural and parenchymal thickening. Other: Carotid artery calcifications. IMPRESSION: 1. No acute intracranial process. 2. Atrophy with chronic microvascular ischemic changes. 3. Near complete opacification of the mastoid air cells and middle ear on the right, possible effusion versus cholesteatoma. 4.  Multilevel degenerative changes in the cervical spine without evidence of acute fracture. Electronically Signed   By: Brett Fairy M.D.   On: 06/30/2022 21:38   CT Cervical Spine Wo Contrast  Result Date: 06/30/2022 CLINICAL DATA:  Fall, head and neck trauma. EXAM: CT HEAD WITHOUT CONTRAST CT CERVICAL SPINE WITHOUT CONTRAST TECHNIQUE: Multidetector CT imaging of the head and cervical spine was performed following the standard protocol without intravenous contrast. Multiplanar CT image reconstructions of the cervical spine were also generated. RADIATION DOSE REDUCTION: This exam was performed according to the departmental dose-optimization program which includes automated exposure control, adjustment of the mA and/or kV according to patient size and/or use of iterative reconstruction technique. COMPARISON:  06/22/2022. FINDINGS: CT HEAD FINDINGS Brain: No acute intracranial hemorrhage, midline shift or mass effect. No extra-axial fluid collection. Diffuse atrophy is noted. Subcortical and periventricular white matter hypodensities are present bilaterally. No hydrocephalus. Vascular: No hyperdense vessel or unexpected calcification. Skull: No acute fracture. Sinuses/Orbits: Minimal mucosal thickening in the right sphenoid sinus. No acute orbital abnormality Other: Near complete opacification of the mastoid air cells and middle ear on the right. CT CERVICAL SPINE FINDINGS Alignment: Normal. Skull base and vertebrae: No acute fracture. There is partial fusion at C4-C5. Soft tissues and spinal canal: No prevertebral fluid or swelling. No visible canal hematoma. Disc levels: Multilevel intervertebral disc space narrowing, disc osteophyte formation, and facet arthropathy. Upper chest: Biapical pleural and parenchymal thickening. Other: Carotid artery calcifications. IMPRESSION: 1. No acute intracranial process. 2. Atrophy with chronic microvascular ischemic changes. 3. Near complete opacification of the mastoid air  cells and middle ear on the right, possible effusion versus cholesteatoma. 4. Multilevel degenerative changes in the cervical spine without evidence of acute fracture. Electronically Signed   By: Brett Fairy M.D.   On: 06/30/2022 21:38   DG Knee Complete 4 Views Right  Result Date: 06/30/2022 CLINICAL DATA:  Fall EXAM: RIGHT KNEE -  COMPLETE 4+ VIEW COMPARISON:  None Available. FINDINGS: No fracture or dislocation is seen. Mild to moderate degenerative changes in the medial and patellofemoral compartments. Visualized soft tissues are within normal limits. No suprapatellar knee joint effusion. IMPRESSION: No fracture or dislocation is seen. Mild-to-moderate degenerative changes. Electronically Signed   By: Charline Bills M.D.   On: 06/30/2022 21:25   DG Hip Unilat W or Wo Pelvis 2-3 Views Right  Result Date: 06/30/2022 CLINICAL DATA:  Fall EXAM: DG HIP (WITH OR WITHOUT PELVIS) 2-3V RIGHT COMPARISON:  None Available. FINDINGS: Intertrochanteric right hip fracture, nondisplaced. Visualized bony pelvis appears intact. Bilateral hip joint spaces are preserved. Vascular calcifications. IMPRESSION: Intertrochanteric right hip fracture, nondisplaced. Electronically Signed   By: Charline Bills M.D.   On: 06/30/2022 21:25    Review of Systems  Unable to perform ROS: Dementia   Blood pressure (!) 151/95, pulse 81, temperature 98 F (36.7 C), temperature source Oral, resp. rate 18, height 5' (1.524 m), weight 51 kg, SpO2 93 %. Physical Exam Constitutional:      General: She is not in acute distress.    Appearance: She is well-developed. She is not diaphoretic.  HENT:     Head: Normocephalic and atraumatic.  Eyes:     General: No scleral icterus.       Right eye: No discharge.        Left eye: No discharge.     Conjunctiva/sclera: Conjunctivae normal.  Cardiovascular:     Rate and Rhythm: Normal rate and regular rhythm.  Pulmonary:     Effort: Pulmonary effort is normal. No respiratory  distress.  Musculoskeletal:     Cervical back: Normal range of motion.     Comments: RLE No traumatic wounds, ecchymosis, or rash  Mod TTP hip  No knee or ankle effusion  Knee stable to varus/ valgus and anterior/posterior stress  Sens DPN, SPN, TN could not assess  Motor EHL, ext, flex, evers grossly intact  DP 1+, PT 1+, No significant edema  Skin:    General: Skin is warm and dry.  Neurological:     Mental Status: She is alert.  Psychiatric:        Mood and Affect: Mood normal.        Behavior: Behavior normal.     Assessment/Plan: Right hip fx -- Plan IMN today with Dr. Carola Frost. Please keep NPO. Multiple medical problems including dementia, hypertension, hyperlipidemia, hypothyroidism, TIA, osteoarthritis, spondylosis of cervical spine, anxiety, GERD, overactive bladder, and hard of hearing -- per primary service    Freeman Caldron, PA-C Orthopedic Surgery [phone] 07/01/2022, 12:53 PM

## 2022-07-01 NOTE — Anesthesia Procedure Notes (Signed)
Procedure Name: Intubation Date/Time: 07/01/2022 3:03 PM  Performed by: Mariea Clonts, CRNAPre-anesthesia Checklist: Patient identified, Emergency Drugs available, Suction available and Patient being monitored Patient Re-evaluated:Patient Re-evaluated prior to induction Oxygen Delivery Method: Circle System Utilized Preoxygenation: Pre-oxygenation with 100% oxygen Induction Type: IV induction and Cricoid Pressure applied Ventilation: Mask ventilation without difficulty Laryngoscope Size: Miller and 2 Grade View: Grade II Tube type: Oral Tube size: 7.0 mm Number of attempts: 1 Airway Equipment and Method: Stylet and Oral airway Placement Confirmation: ETT inserted through vocal cords under direct vision, positive ETCO2 and breath sounds checked- equal and bilateral Tube secured with: Tape Dental Injury: Teeth and Oropharynx as per pre-operative assessment

## 2022-07-01 NOTE — H&P (Signed)
History and Physical    [name] MAU:[id number] DOB: 1924/03/27 DOA: 06/30/2022  PCP: Patient, No Pcp Per  Patient coming from: SNF  Chief Complaint: Fall  HPI: [name] is a 89 y.o. female with medical history significant of dementia, hypertension, hyperlipidemia, hypothyroidism, TIA, osteoarthritis, spondylosis of cervical spine, anxiety, GERD, overactive bladder, hard of hearing presented to the ED after a fall.  Reportedly patient was agitated and fighting with CNA when she lost her balance and fell.  Reportedly hit her head.  Patient complained of pain in her right knee and right hip.  Vital signs on arrival to the ED: Temperature 97.3 F, pulse 86, respiratory rate 18, blood pressure 149/75, SpO2 100% on room air. Labs showing WBC 10.5, hemoglobin 12.9, platelet count 219k, sodium 137, potassium 3.9, chloride 95, bicarb 29, BUN 24, creatinine 0.8, glucose 148, calcium 8.4, INR 1.1.  X-ray of right hip/pelvis showing nondisplaced intertrochanteric right hip fracture.  X-ray of right knee negative for fracture or dislocation.  CT head/C-spine negative for acute traumatic injuries.  Chest x-ray showing airspace opacities in the right mid/lower lung favored to be scarring/atelectasis; no focal consolidation. ED physician discussed the case with Dr. Kathaleen Bury with orthopedic surgery who requested admission at [facility] for surgery either tomorrow or the next day based on the OR availability.  Patient was given fentanyl for pain.  [facility] called to admit.  Patient is not able to give any history, resting comfortably.  No family at bedside.  I called her daughter Consetta Cosner and spoke to her on the phone.  Daughter informed me that patient had a fall at her assisted living facility a week ago and was evaluated in the emergency room.  Today they moved her from her assisted living facility to a skilled nursing facility.  Daughter informed me that the patient is "100% deaf" and  unfortunately lost her hearing aids during the move.  Daughter is concerned that the patient has dementia and became confused in her new room at the skilled nursing facility.  States she was given Ativan over there for agitation and patient subsequently fell while walking down the hall.  Review of Systems:  Review of Systems  All other systems reviewed and are negative.   Past Medical History:  Diagnosis Date   Dementia ([place])    Hypertension    Osteoarthritis    Spondylosis of cervical spine    Toxic encephalopathy    UTI (urinary tract infection)     History reviewed. No pertinent surgical history.   reports that she has never smoked. She has never used smokeless tobacco. She reports that she does not currently use alcohol. She reports that she does not use drugs.  Allergies  Allergen Reactions   Chlorthalidone Nausea And Vomiting   Metoprolol Succinate Other (See Comments)    Reaction unconfirmed    History reviewed. No pertinent family history.  Prior to Admission medications   Medication Sig Start Date End Date Taking? Authorizing Provider  acetaminophen (TYLENOL) 325 MG tablet Take 650 mg by mouth every 4 (four) hours as needed for mild pain (or a fever of 100 F or greater).   Yes [provider]  ALPRAZolam (XANAX) 0.25 MG tablet Take 0.25 mg by mouth See admin instructions. Take 0.25 mg by mouth 30 minutes prior to baths on Mondays and Thursdays AND once a day as needed for agitation or anxiety 06/30/22 07/13/22 Yes [provider]  aspirin 81 MG chewable tablet Chew 81 mg  by mouth daily.   Yes [provider]  atorvastatin (LIPITOR) 10 MG tablet Take 10 mg by mouth daily.   Yes [provider]  Cholecalciferol (QC VITAMIN D3) 50 MCG (2000 UT) CAPS Take 2,000 Units by mouth daily.   Yes [provider]  divalproex (DEPAKOTE) 125 MG DR tablet Take 125 mg by mouth 2 (two) times daily.   Yes [provider]  famotidine  (PEPCID) 20 MG tablet Take 20 mg by mouth daily.   Yes [provider]  furosemide (LASIX) 20 MG tablet Take 20 mg by mouth every evening.   Yes [provider]  furosemide (LASIX) 40 MG tablet Take 40 mg by mouth in the morning.   Yes [provider]  levothyroxine (SYNTHROID) 50 MCG tablet Take 50 mcg by mouth daily.   Yes [provider]  Melatonin 5 MG CHEW Chew 5 mg by mouth at bedtime as needed (for insomnia).   Yes [provider]  MUCINEX 600 MG 12 hr tablet Take 600 mg by mouth every 12 (twelve) hours as needed for cough.   Yes [provider]  ondansetron (ZOFRAN-ODT) 4 MG disintegrating tablet Take 4 mg by mouth every 4 (four) hours as needed for nausea or vomiting.   Yes [provider]  polyethylene glycol powder (GLYCOLAX/MIRALAX) 17 GM/SCOOP powder Take 17 g by mouth daily as needed for mild constipation.   Yes [provider]  tolterodine (DETROL LA) 4 MG 24 hr capsule Take 4 mg by mouth daily.   Yes [provider]  zinc oxide 20 % ointment Apply 1 Application topically See admin instructions. Apply to buttocks every shift   Yes [provider]    Physical Exam: Vitals:   06/30/22 1959 06/30/22 2247 06/30/22 2330 06/30/22 2353  BP: (!) 149/75 (!) 174/94 (!) 174/83   Pulse: 86 91 (!) 106   Resp: 18 17 17    Temp: (!) 97.3 F (36.3 C)   98.4 F (36.9 C)  TempSrc: Oral   Oral  SpO2: 100% 99% 98%   Weight: 50.8 kg     Height: 5' (1.524 m)       Physical Exam Vitals reviewed.  Constitutional:      General: She is not in acute distress. HENT:     Head: Normocephalic and atraumatic.  Eyes:     Extraocular Movements: Extraocular movements intact.  Cardiovascular:     Rate and Rhythm: Normal rate and regular rhythm.     Pulses: Normal pulses.  Pulmonary:     Effort: Pulmonary effort is normal. No respiratory distress.     Breath sounds: No wheezing.  Abdominal:     General: Bowel  sounds are normal. There is no distension.     Palpations: Abdomen is soft.     Tenderness: There is no abdominal tenderness.  Musculoskeletal:     Cervical back: Normal range of motion.     Right lower leg: Edema present.     Left lower leg: Edema present.     Comments: +2 bilateral pedal edema Right lower extremity shortened and externally rotated  Skin:    General: Skin is warm and dry.  Neurological:     General: No focal deficit present.     Mental Status: She is alert and oriented to person, place, and time.     Labs on Admission: I have personally reviewed following labs and imaging studies  CBC: Recent Labs  Lab 06/30/22 2214  WBC 10.5  NEUTROABS 9.0*  HGB 12.9  HCT 40.1  MCV 92.0  PLT 219   Basic Metabolic Panel: Recent Labs  Lab 06/30/22 2214  NA 137  K 3.9  CL 95*  CO2 29  GLUCOSE 148*  BUN 24*  CREATININE 0.87  CALCIUM 8.4*   GFR: Estimated Creatinine Clearance: 27.2 mL/min (by C-G formula based on SCr of 0.87 mg/dL). Liver Function Tests: No results for input(s): "AST", "ALT", "ALKPHOS", "BILITOT", "PROT", "ALBUMIN" in the last 168 hours. No results for input(s): "LIPASE", "AMYLASE" in the last 168 hours. No results for input(s): "AMMONIA" in the last 168 hours. Coagulation Profile: Recent Labs  Lab 06/30/22 2214  INR 1.1   Cardiac Enzymes: No results for input(s): "CKTOTAL", "CKMB", "CKMBINDEX", "TROPONINI" in the last 168 hours. BNP (last 3 results) No results for input(s): "PROBNP" in the last 8760 hours. HbA1C: No results for input(s): "HGBA1C" in the last 72 hours. CBG: No results for input(s): "GLUCAP" in the last 168 hours. Lipid Profile: No results for input(s): "CHOL", "HDL", "LDLCALC", "TRIG", "CHOLHDL", "LDLDIRECT" in the last 72 hours. Thyroid Function Tests: No results for input(s): "TSH", "T4TOTAL", "FREET4", "T3FREE", "THYROIDAB" in the last 72 hours. Anemia Panel: No results for input(s): "VITAMINB12", "FOLATE",  "FERRITIN", "TIBC", "IRON", "RETICCTPCT" in the last 72 hours. Urine analysis: No results found for: "COLORURINE", "APPEARANCEUR", "LABSPEC", "PHURINE", "GLUCOSEU", "HGBUR", "BILIRUBINUR", "KETONESUR", "PROTEINUR", "UROBILINOGEN", "NITRITE", "LEUKOCYTESUR"  Radiological Exams on Admission: DG Chest Portable 1 View  Result Date: 06/30/2022 CLINICAL DATA:  Preop for hip fracture EXAM: PORTABLE CHEST 1 VIEW COMPARISON:  None Available. FINDINGS: Airspace opacities in the right mid/lower lung are favored atelectasis/scarring. Otherwise no focal consolidation. No pleural effusion or pneumothorax. Normal cardiomediastinal silhouette. Aortic atherosclerotic calcification. No acute fracture. Degenerative arthritis both shoulders. IMPRESSION: Presumed scarring/atelectasis in the right mid/lower lung. Electronically Signed   By: Minerva Fester M.D.   On: 06/30/2022 23:09   CT HEAD WO CONTRAST ( )  Result Date: 06/30/2022 CLINICAL DATA:  Fall, head and neck trauma. EXAM: CT HEAD WITHOUT CONTRAST CT CERVICAL SPINE WITHOUT CONTRAST TECHNIQUE: Multidetector CT imaging of the head and cervical spine was performed following the standard protocol without intravenous contrast. Multiplanar CT image reconstructions of the cervical spine were also generated. RADIATION DOSE REDUCTION: This exam was performed according to the departmental dose-optimization program which includes automated exposure control, adjustment of the mA and/or kV according to patient size and/or use of iterative reconstruction technique. COMPARISON:  06/22/2022. FINDINGS: CT HEAD FINDINGS Brain: No acute intracranial hemorrhage, midline shift or mass effect. No extra-axial fluid collection. Diffuse atrophy is noted. Subcortical and periventricular white matter hypodensities are present bilaterally. No hydrocephalus. Vascular: No hyperdense vessel or unexpected calcification. Skull: No acute fracture. Sinuses/Orbits: Minimal mucosal thickening in the  right sphenoid sinus. No acute orbital abnormality Other: Near complete opacification of the mastoid air cells and middle ear on the right. CT CERVICAL SPINE FINDINGS Alignment: Normal. Skull base and vertebrae: No acute fracture. There is partial fusion at C4-C5. Soft tissues and spinal canal: No prevertebral fluid or swelling. No visible canal hematoma. Disc levels: Multilevel intervertebral disc space narrowing, disc osteophyte formation, and facet arthropathy. Upper chest: Biapical pleural and parenchymal thickening. Other: Carotid artery calcifications. IMPRESSION: 1. No acute intracranial process. 2. Atrophy with chronic microvascular ischemic changes. 3. Near complete opacification of the mastoid air cells and middle ear on the right, possible effusion versus cholesteatoma. 4. Multilevel degenerative changes in the cervical spine without evidence of acute fracture. Electronically Signed   By:  Brett Fairy M.D.   On: 06/30/2022 21:38   CT Cervical Spine Wo Contrast  Result Date: 06/30/2022 CLINICAL DATA:  Fall, head and neck trauma. EXAM: CT HEAD WITHOUT CONTRAST CT CERVICAL SPINE WITHOUT CONTRAST TECHNIQUE: Multidetector CT imaging of the head and cervical spine was performed following the standard protocol without intravenous contrast. Multiplanar CT image reconstructions of the cervical spine were also generated. RADIATION DOSE REDUCTION: This exam was performed according to the departmental dose-optimization program which includes automated exposure control, adjustment of the mA and/or kV according to patient size and/or use of iterative reconstruction technique. COMPARISON:  06/22/2022. FINDINGS: CT HEAD FINDINGS Brain: No acute intracranial hemorrhage, midline shift or mass effect. No extra-axial fluid collection. Diffuse atrophy is noted. Subcortical and periventricular white matter hypodensities are present bilaterally. No hydrocephalus. Vascular: No hyperdense vessel or unexpected calcification.  Skull: No acute fracture. Sinuses/Orbits: Minimal mucosal thickening in the right sphenoid sinus. No acute orbital abnormality Other: Near complete opacification of the mastoid air cells and middle ear on the right. CT CERVICAL SPINE FINDINGS Alignment: Normal. Skull base and vertebrae: No acute fracture. There is partial fusion at C4-C5. Soft tissues and spinal canal: No prevertebral fluid or swelling. No visible canal hematoma. Disc levels: Multilevel intervertebral disc space narrowing, disc osteophyte formation, and facet arthropathy. Upper chest: Biapical pleural and parenchymal thickening. Other: Carotid artery calcifications. IMPRESSION: 1. No acute intracranial process. 2. Atrophy with chronic microvascular ischemic changes. 3. Near complete opacification of the mastoid air cells and middle ear on the right, possible effusion versus cholesteatoma. 4. Multilevel degenerative changes in the cervical spine without evidence of acute fracture. Electronically Signed   By: Brett Fairy M.D.   On: 06/30/2022 21:38   DG Knee Complete 4 Views Right  Result Date: 06/30/2022 CLINICAL DATA:  Fall EXAM: RIGHT KNEE - COMPLETE 4+ VIEW COMPARISON:  None Available. FINDINGS: No fracture or dislocation is seen. Mild to moderate degenerative changes in the medial and patellofemoral compartments. Visualized soft tissues are within normal limits. No suprapatellar knee joint effusion. IMPRESSION: No fracture or dislocation is seen. Mild-to-moderate degenerative changes. Electronically Signed   By: Julian Hy M.D.   On: 06/30/2022 21:25   DG Hip Unilat W or Wo Pelvis 2-3 Views Right  Result Date: 06/30/2022 CLINICAL DATA:  Fall EXAM: DG HIP (WITH OR WITHOUT PELVIS) 2-3V RIGHT COMPARISON:  None Available. FINDINGS: Intertrochanteric right hip fracture, nondisplaced. Visualized bony pelvis appears intact. Bilateral hip joint spaces are preserved. Vascular calcifications. IMPRESSION: Intertrochanteric right hip  fracture, nondisplaced. Electronically Signed   By: Julian Hy M.D.   On: 06/30/2022 21:25    EKG: Independently reviewed.  Sinus rhythm, no recent tracing for comparison.  Assessment and Plan  Right hip fracture Secondary to a mechanical fall.  X-ray showing nondisplaced intertrochanteric right hip fracture. -Orthopedics consulted and requested admission to [facility] until patient is evaluated by orthopedics -Gentle IV fluid hydration.  She has pedal edema but no evidence of pulmonary edema on imaging.  No history of CHF or prior echo results in the chart.  Check BNP. -Continue pain management -Hold home aspirin  Incidental finding on imaging CT showing near complete opacification of the mastoid air cells and middle ear on the right, possible effusion versus cholesteatoma.  No fever or leukocytosis to suggest acute bacterial infection.  Discussed with the patient's daughter who informed me that the patient has regular outpatient follow-up with Dr. Benjamine Mola with ENT. -Outpatient ENT follow-up  Dementia Mood  disorder Reportedly agitated and combative at her nursing facility.  At present, patient is calm and resting comfortably. -Delirium precautions -Avoid benzodiazepines -Continue Depakote  Hypertension Blood pressure elevated with systolic in the 160s to 170s. -Continue pain management -Hydralazine PRN  Hyperlipidemia -Continue Lipitor  Hypothyroidism -Continue Synthroid  GERD -Continue Pepcid  Overactive bladder -Continue home medication  DVT prophylaxis: SCDs Code Status: DNR/DNI (discussed with the patient's daughter) Family Communication: Daughter updated. Consults called: Orthopedics Level of care: Telemetry bed Admission status: It is my clinical opinion that admission to INPATIENT is reasonable and necessary because of the expectation that this patient will require hospital care that crosses at least 2 midnights to treat this condition  based on the medical complexity of the problems presented.  Given the aforementioned information, the predictability of an adverse outcome is felt to be significant.   John Giovanni MD [facility]  If 7PM-7AM, please contact night-coverage [email or web address]  07/01/2022, 12:07 AM

## 2022-07-01 NOTE — ED Notes (Signed)
Patient is aggitated and states"my leg hurts so bad"  Morphine given per orders

## 2022-07-02 ENCOUNTER — Encounter (HOSPITAL_COMMUNITY): Payer: Self-pay | Admitting: Orthopedic Surgery

## 2022-07-02 DIAGNOSIS — I10 Essential (primary) hypertension: Secondary | ICD-10-CM

## 2022-07-02 DIAGNOSIS — F01511 Vascular dementia, unspecified severity, with agitation: Secondary | ICD-10-CM | POA: Diagnosis not present

## 2022-07-02 DIAGNOSIS — E7800 Pure hypercholesterolemia, unspecified: Secondary | ICD-10-CM

## 2022-07-02 DIAGNOSIS — K219 Gastro-esophageal reflux disease without esophagitis: Secondary | ICD-10-CM | POA: Diagnosis not present

## 2022-07-02 DIAGNOSIS — S72001A Fracture of unspecified part of neck of right femur, initial encounter for closed fracture: Secondary | ICD-10-CM | POA: Diagnosis not present

## 2022-07-02 DIAGNOSIS — E034 Atrophy of thyroid (acquired): Secondary | ICD-10-CM

## 2022-07-02 LAB — CBC
HCT: 29.1 % — ABNORMAL LOW (ref 36.0–46.0)
Hemoglobin: 9.4 g/dL — ABNORMAL LOW (ref 12.0–15.0)
MCH: 30.3 pg (ref 26.0–34.0)
MCHC: 32.3 g/dL (ref 30.0–36.0)
MCV: 93.9 fL (ref 80.0–100.0)
Platelets: 183 10*3/uL (ref 150–400)
RBC: 3.1 MIL/uL — ABNORMAL LOW (ref 3.87–5.11)
RDW: 14 % (ref 11.5–15.5)
WBC: 7.3 10*3/uL (ref 4.0–10.5)
nRBC: 0 % (ref 0.0–0.2)

## 2022-07-02 MED ORDER — DOCUSATE SODIUM 100 MG PO CAPS
100.0000 mg | ORAL_CAPSULE | Freq: Two times a day (BID) | ORAL | Status: DC
  Administered 2022-07-02 – 2022-07-05 (×4): 100 mg via ORAL
  Filled 2022-07-02 (×6): qty 1

## 2022-07-02 MED ORDER — ENSURE ENLIVE PO LIQD
237.0000 mL | Freq: Two times a day (BID) | ORAL | Status: DC
  Administered 2022-07-04 – 2022-07-05 (×3): 237 mL via ORAL

## 2022-07-02 MED ORDER — ADULT MULTIVITAMIN W/MINERALS CH
1.0000 | ORAL_TABLET | Freq: Every day | ORAL | Status: DC
  Administered 2022-07-02 – 2022-07-05 (×4): 1 via ORAL
  Filled 2022-07-02 (×4): qty 1

## 2022-07-02 NOTE — Evaluation (Signed)
Physical Therapy Evaluation  Patient Details Name: [name] MRN: [id number] DOB: 1923/09/09 Today's Date: 07/02/2022  History of Present Illness  Pt is a 89 year old woman admitted after a fall on 1/31 resulting in R hip fx, underwent IM nail on 2/1. PMH: dementia, HTN, OA, c-spine spondylosis, UTI, HLD, hypothyroidism, TIA, overactive bladder, hearing loss.   Clinical Impression  Pt admitted with above diagnosis. Pt currently with functional limitations due to the deficits listed below (see PT Problem List). At the time of PT eval pt was able to perform transfers with up to +2 max assist for balance support and safety. Pt with complaints of pain in R hip - oriented to situation and that she had surgery. RN notified pt requesting pain meds and ice applied to hip. Pt will benefit from skilled PT to increase their independence and safety with mobility to allow discharge to the venue listed below.          Recommendations for follow up therapy are one component of a multi-disciplinary discharge planning process, led by the attending physician.  Recommendations may be updated based on patient status, additional functional criteria and insurance authorization.  Follow Up Recommendations Skilled nursing-short term rehab (<3 hours/day) Can patient physically be transported by private vehicle: No    Assistance Recommended at Discharge Frequent or constant Supervision/Assistance  Patient can return home with the following  Two people to help with walking and/or transfers;Two people to help with bathing/dressing/bathroom;Assistance with cooking/housework;Assist for transportation;Help with stairs or ramp for entrance    Equipment Recommendations None recommended by PT (TBD by next venue of care)  Recommendations for Other Services       Functional Status Assessment Patient has had a recent decline in their functional status and demonstrates the ability to make significant improvements in  function in a reasonable and predictable amount of time.     Precautions / Restrictions Precautions Precautions: Fall Precaution Comments: mitts, history of being combative with staff Restrictions Weight Bearing Restrictions: Yes RLE Weight Bearing: Weight bearing as tolerated      Mobility  Bed Mobility Overal bed mobility: Needs Assistance Bed Mobility: Supine to Sit, Sit to Supine     Supine to sit: +2 for physical assistance, Max assist, HOB elevated Sit to supine: +2 for physical assistance, Max assist   General bed mobility comments: pt initiating LEs toward EOB and raising trunk, guided trunk and assisted LEs back into bed, pt helping to reposition toward HOB pushing with her L LE    Transfers Overall transfer level: Needs assistance Equipment used: 2 person hand held assist Transfers: Sit to/from Stand Sit to Stand: +2 physical assistance, Max assist           General transfer comment: assist to rise and steady, pt tolerating briefly before stating she needed to return to sitting    Ambulation/Gait               General Gait Details: Unable to progress to gait training at this time.  Stairs            Wheelchair Mobility    Modified Rankin (Stroke Patients Only)       Balance Overall balance assessment: Needs assistance Sitting-balance support: No upper extremity supported, Feet supported Sitting balance-Leahy Scale: Fair Sitting balance - Comments: min guard   Standing balance support: Bilateral upper extremity supported Standing balance-Leahy Scale: Poor  Pertinent Vitals/Pain Pain Assessment Pain Assessment: Faces Faces Pain Scale: Hurts even more Pain Location: R hip Pain Descriptors / Indicators: Discomfort, Sore, Grimacing, Guarding Pain Intervention(s): Limited activity within patient's tolerance, Monitored during session, Repositioned    Home Living Family/patient expects to be  discharged to:: Skilled nursing facility                        Prior Function Prior Level of Function : Patient poor historian/Family not available                     Hand Dominance   Dominant Hand: Right    Extremity/Trunk Assessment   Upper Extremity Assessment Upper Extremity Assessment: Defer to OT evaluation    Lower Extremity Assessment Lower Extremity Assessment: RLE deficits/detail RLE Deficits / Details: Pt guarding RLE due to pain. Actively moving for bed mobility however appropriately limited ROM due to pain.    Cervical / Trunk Assessment Cervical / Trunk Assessment: Kyphotic  Communication   Communication: HOH;Other (comment) (R ear appears better)  Cognition Arousal/Alertness: Awake/alert Behavior During Therapy: Flat affect Overall Cognitive Status: History of cognitive impairments - at baseline                                 General Comments: pt with h/o dementia        General Comments      Exercises     Assessment/Plan    PT Assessment Patient needs continued PT services  PT Problem List Decreased strength;Decreased range of motion;Decreased activity tolerance;Decreased mobility;Decreased balance;Decreased knowledge of use of DME;Decreased safety awareness;Decreased knowledge of precautions;Pain;Decreased cognition       PT Treatment Interventions DME instruction;Gait training;Functional mobility training;Therapeutic activities;Therapeutic exercise;Balance training;Patient/family education    PT Goals (Current goals can be found in the Care Plan section)  Acute Rehab PT Goals Patient Stated Goal: None stated PT Goal Formulation: Patient unable to participate in goal setting Time For Goal Achievement: 07/16/22 Potential to Achieve Goals: Fair    Frequency Min 2X/week     Co-evaluation PT/OT/SLP Co-Evaluation/Treatment: Yes Reason for Co-Treatment: Necessary to address cognition/behavior during functional  activity;For patient/therapist safety;To address functional/ADL transfers PT goals addressed during session: Mobility/safety with mobility;Balance;Strengthening/ROM OT goals addressed during session: ADL's and self-care;Strengthening/ROM       AM-PAC PT "6 Clicks" Mobility  Outcome Measure Help needed turning from your back to your side while in a flat bed without using bedrails?: A Lot Help needed moving from lying on your back to sitting on the side of a flat bed without using bedrails?: Total Help needed moving to and from a bed to a chair (including a wheelchair)?: Total Help needed standing up from a chair using your arms (e.g., wheelchair or bedside chair)?: Total Help needed to walk in hospital room?: Total Help needed climbing 3-5 steps with a railing? : Total 6 Click Score: 7    End of Session Equipment Utilized During Treatment: Gait belt;Oxygen Activity Tolerance: Patient tolerated treatment well Patient left: in bed;with call bell/phone within reach;with bed alarm set (Mitts reapplied) Nurse Communication: Mobility status PT Visit Diagnosis: Unsteadiness on feet (R26.81);History of falling (Z91.81);Pain Pain - Right/Left: Right Pain - part of body: Hip    Time: 5638-7564 PT Time Calculation (min) (ACUTE ONLY): 23 min   Charges:   PT Evaluation $PT Eval Moderate Complexity: 1 Mod  Rolinda Roan, PT, DPT Acute Rehabilitation Services Secure Chat Preferred Office: [phone]   Thelma Comp 07/02/2022, 2:00 PM

## 2022-07-02 NOTE — Evaluation (Signed)
Occupational Therapy Evaluation Patient Details Name: [name] MRN: [id number] DOB: [birth year] Today's Date: 07/02/2022   History of Present Illness Pt is a 89 year old woman admitted after a fall on 1/31 resulting in R hip fx, underwent IM nail on 2/1. PMH: dementia, HTN, OA, c-spine spondylosis, UTI, HLD, hypothyroidism. TIA, overactive bladder, hearing loss.   Clinical Impression   Pt family available to provide PLOF. Pt in bed and asking to have mitts removed. Cooperative and disoriented to situation. Pt with R hip soreness, requested pain meds for pt and applied ice. Pt with SpO2 of 92% or greater on RA throughout session. Pt requires +2 max assist for all mobility, but was able to demonstrate fair sitting balance and ability to stand with B HHA. She needs set up to total assist for ADLs. Recommending SNF with short term rehab.      Recommendations for follow up therapy are one component of a multi-disciplinary discharge planning process, led by the attending physician.  Recommendations may be updated based on patient status, additional functional criteria and insurance authorization.   Follow Up Recommendations  Skilled nursing-short term rehab (<3 hours/day)     Assistance Recommended at Discharge Frequent or constant Supervision/Assistance  Patient can return home with the following Two people to help with walking and/or transfers;A lot of help with bathing/dressing/bathroom;Assistance with cooking/housework;Direct supervision/assist for medications management;Direct supervision/assist for financial management;Assist for transportation;Help with stairs or ramp for entrance    Functional Status Assessment  Patient has had a recent decline in their functional status and demonstrates the ability to make significant improvements in function in a reasonable and predictable amount of time.  Equipment Recommendations  Hospital bed;Wheelchair (measurements OT);Wheelchair cushion  (measurements OT)    Recommendations for Other Services       Precautions / Restrictions Precautions Precautions: Fall Restrictions Weight Bearing Restrictions: Yes RLE Weight Bearing: Weight bearing as tolerated      Mobility Bed Mobility Overal bed mobility: Needs Assistance Bed Mobility: Supine to Sit, Sit to Supine     Supine to sit: +2 for physical assistance, Max assist, HOB elevated Sit to supine: +2 for physical assistance, Max assist   General bed mobility comments: pt initiating LEs toward EOB and raising trunk, guided trunk and assisted LEs back into bed, pt helping to reposition toward HOB pushing with her L LE    Transfers Overall transfer level: Needs assistance Equipment used: 2 person hand held assist Transfers: Sit to/from Stand Sit to Stand: +2 physical assistance, Max assist           General transfer comment: assist to rise and steady, pt tolerating briefly before stating she needed to return to sitting      Balance Overall balance assessment: Needs assistance Sitting-balance support: No upper extremity supported, Feet supported Sitting balance-Leahy Scale: Fair Sitting balance - Comments: min guard   Standing balance support: Bilateral upper extremity supported Standing balance-Leahy Scale: Poor                             ADL either performed or assessed with clinical judgement   ADL Overall ADL's : Needs assistance/impaired Eating/Feeding: Sitting;Set up   Grooming: Wash/dry face;Sitting;Min guard   Upper Body Bathing: Moderate assistance;Sitting   Lower Body Bathing: Total assistance;+2 for physical assistance;Sit to/from stand   Upper Body Dressing : Moderate assistance;Sitting   Lower Body Dressing: Total assistance;Bed level       Toileting- Clothing Manipulation  and Hygiene: Total assistance;Bed level               Vision Ability to See in Adequate Light: 0 Adequate       Perception     Praxis       Pertinent Vitals/Pain Pain Assessment Pain Assessment: Faces Faces Pain Scale: Hurts even more Pain Location: R hip Pain Descriptors / Indicators: Discomfort, Sore, Grimacing, Guarding Pain Intervention(s): Ice applied, Monitored during session, Repositioned, Patient requesting pain meds-RN notified     Hand Dominance Right   Extremity/Trunk Assessment Upper Extremity Assessment Upper Extremity Assessment: Generalized weakness (arthritic changes in hands)   Lower Extremity Assessment Lower Extremity Assessment: Defer to PT evaluation   Cervical / Trunk Assessment Cervical / Trunk Assessment: Kyphotic   Communication Communication Communication: HOH;Other (comment) (R ear appears better)   Cognition Arousal/Alertness: Awake/alert Behavior During Therapy: Flat affect Overall Cognitive Status: History of cognitive impairments - at baseline                                 General Comments: pt with h/o dementia     General Comments       Exercises     Shoulder Instructions      Home Living Family/patient expects to be discharged to:: Skilled nursing facility                                        Prior Functioning/Environment Prior Level of Function : Patient poor historian/Family not available                        OT Problem List: Decreased strength;Impaired balance (sitting and/or standing);Decreased cognition;Decreased knowledge of use of DME or AE;Pain      OT Treatment/Interventions: Self-care/ADL training;DME and/or AE instruction;Therapeutic activities;Patient/family education;Balance training    OT Goals(Current goals can be found in the care plan section) Acute Rehab OT Goals OT Goal Formulation: Patient unable to participate in goal setting Time For Goal Achievement: 07/16/22 Potential to Achieve Goals: Fair ADL Goals Pt Will Perform Grooming: with supervision;sitting Pt Will Perform Upper Body Dressing: with  supervision;sitting Pt Will Transfer to Toilet: with mod assist;stand pivot transfer;bedside commode Additional ADL Goal #1: Pt will perform bed mobility with moderate assist in preparation for ADLs.  OT Frequency: Min 2X/week    Co-evaluation PT/OT/SLP Co-Evaluation/Treatment: Yes Reason for Co-Treatment: For patient/therapist safety;Necessary to address cognition/behavior during functional activity   OT goals addressed during session: ADL's and self-care;Strengthening/ROM      AM-PAC OT "6 Clicks" Daily Activity     Outcome Measure Help from another person eating meals?: A Little Help from another person taking care of personal grooming?: A Little Help from another person toileting, which includes using toliet, bedpan, or urinal?: Total Help from another person bathing (including washing, rinsing, drying)?: A Lot Help from another person to put on and taking off regular upper body clothing?: A Lot Help from another person to put on and taking off regular lower body clothing?: Total 6 Click Score: 12   End of Session Equipment Utilized During Treatment: Gait belt Nurse Communication: Patient requests pain meds  Activity Tolerance: Patient tolerated treatment well Patient left: in bed;with call bell/phone within reach;with bed alarm set  OT Visit Diagnosis: Unsteadiness on feet (R26.81);Other abnormalities of gait and mobility (R26.89);Pain;Muscle  weakness (generalized) (M62.81);Other symptoms and signs involving cognitive function Pain - Right/Left: Right Pain - part of body: Hip                Time: 7371-0626 OT Time Calculation (min): 23 min Charges:  OT General Charges $OT Visit: 1 Visit OT Evaluation $OT Eval Moderate Complexity: [place], OTR/L Acute Rehabilitation Services Office: [phone]   Malka So 07/02/2022, 1:39 PM

## 2022-07-02 NOTE — NC FL2 (Signed)
[place] LEVEL OF CARE FORM     IDENTIFICATION  Patient Name: [name] Birthdate: 04/02/1924 Sex: female Admission Date (Current Location): 06/30/2022  [facility] and [place] Number:  Herbalist and Address:  [facility] [place] [address], [place], [place] [address]      Provider Number: [id number]  Attending Physician Name and Address:  Barb Merino, MD  Relative Name and Phone Number:       Current Level of Care: Hospital Recommended Level of Care: [place] Prior Approval Number:    Date Approved/Denied:   PASRR Number: [id number]  Discharge Plan: SNF    Current Diagnoses: Patient Active Problem List   Diagnosis Date Noted   Hip fracture ([place]) 07/01/2022   Dementia ([place]) 07/01/2022   Mood disorder ([place]) 07/01/2022   Hypothyroidism 07/01/2022   GERD (gastroesophageal reflux disease) 07/01/2022   Overactive bladder 07/01/2022   PALPITATIONS 09/18/2009   Hyperlipidemia 09/16/2009   CATARACTS 09/16/2009   Essential hypertension 09/16/2009    Orientation RESPIRATION BLADDER Height & Weight        O2 Incontinent Weight: 112 lb 7 oz (51 kg) Height:  5' (152.4 cm)  BEHAVIORAL SYMPTOMS/MOOD NEUROLOGICAL BOWEL NUTRITION STATUS      Incontinent    AMBULATORY STATUS COMMUNICATION OF NEEDS Skin   Extensive Assist Verbally Surgical wounds                       Personal Care Assistance Level of Assistance  Bathing, Feeding, Dressing Bathing Assistance: Maximum assistance Feeding assistance: Limited assistance Dressing Assistance: Maximum assistance     Functional Limitations Info  Sight, Hearing, Speech Sight Info: Impaired Hearing Info: Impaired Speech Info: Impaired    SPECIAL CARE FACTORS FREQUENCY  OT (By licensed OT), PT (By licensed PT)                    Contractures Contractures Info: Not present    Additional Factors Info  Code Status Code Status Info: DNR              Current Medications (07/02/2022):  This is the current hospital active medication list Current Facility-Administered Medications  Medication Dose Route Frequency Provider Last Rate Last Admin   acetaminophen (TYLENOL) tablet 650 mg  650 mg Oral Q6H Ainsley Spinner, PA-C   650 mg at 07/02/22 1112   atorvastatin (LIPITOR) tablet 10 mg  10 mg Oral Daily Ainsley Spinner, PA-C   10 mg at 07/02/22 1112   divalproex (DEPAKOTE) DR tablet 125 mg  125 mg Oral BID Ainsley Spinner, PA-C   125 mg at 07/02/22 1112   docusate sodium (COLACE) capsule 100 mg  100 mg Oral BID Barb Merino, MD   100 mg at 07/02/22 1444   enoxaparin (LOVENOX) injection 30 mg  30 mg Subcutaneous Q24H Wendee Beavers, RPH   30 mg at 07/02/22 1112   famotidine (PEPCID) tablet 20 mg  20 mg Oral Daily Ainsley Spinner, PA-C   20 mg at 07/02/22 1112   fesoterodine (TOVIAZ) tablet 4 mg  4 mg Oral Daily Ainsley Spinner, PA-C   4 mg at 07/02/22 1112   hydrALAZINE (APRESOLINE) injection 5 mg  5 mg Intravenous Q6H PRN Ainsley Spinner, PA-C       levothyroxine (SYNTHROID) tablet 50 mcg  50 mcg Oral Daily Ainsley Spinner, PA-C   50 mcg at 07/02/22 0602   morphine (PF) 2 MG/ML injection 1 mg  1  mg Intravenous Q4H PRN Ainsley Spinner, PA-C   1 mg at 07/01/22 2136   naloxone [facility]) injection 0.4 mg  0.4 mg Intravenous PRN Ainsley Spinner, PA-C       oxyCODONE (Oxy IR/ROXICODONE) immediate release tablet 2.5-5 mg  2.5-5 mg Oral Q6H PRN Ainsley Spinner, PA-C         Discharge Medications: Please see discharge summary for a list of discharge medications.  Relevant Imaging Results:  Relevant Lab Results:   Additional Information   SS# [id number]  Amador Cunas, [place]

## 2022-07-02 NOTE — TOC Initial Note (Signed)
Transition of Care [facility]) - Initial/Assessment Note    Patient Details  Name: [name] MRN: [id number] Date of Birth: 1923-06-02  [facility]) CM/SW Contact:    Amador Cunas, [place] Phone Number: 07/02/2022, 3:16 PM  Clinical Narrative: Pt admitted from [facility]. Spoke to Elyse Hsu who reports pt was recently transferred from their ALF to SNF for LTC. Elyse Hsu requesting insurance auth be submitted but pt is able to return with auth pending. Per MD, plan for dc Saturday. Weekend SW will need to contact [employer] to facilitate dc. [place] auth submitted via portal.  Wandra Feinstein, MSW, LCSW [phone] (coverage)                    Expected Discharge Plan: [place] Barriers to Discharge: Continued Medical Work up   Patient Goals and CMS Choice     Choice offered to / list presented to : NA      Expected Discharge Plan and Services       Living arrangements for the past 2 months: [place], [place]                                      Prior Living Arrangements/Services Living arrangements for the past 2 months: [place], [place] Lives with:: Facility Resident                   Activities of Daily Living Home Assistive Devices/Equipment: Environmental consultant (specify type) ADL Screening (condition at time of admission) Patient's cognitive ability adequate to safely complete daily activities?: No Is the patient deaf or have difficulty hearing?: Yes Does the patient have difficulty seeing, even when wearing glasses/contacts?: No Does the patient have difficulty concentrating, remembering, or making decisions?: Yes Patient able to express need for assistance with ADLs?: No Does the patient have difficulty dressing or bathing?: Yes Independently performs ADLs?: No Communication: Needs assistance Is this a change from baseline?: Pre-admission  baseline Dressing (OT): Dependent Is this a change from baseline?: Pre-admission baseline Grooming: Dependent Is this a change from baseline?: Pre-admission baseline Feeding: Needs assistance Is this a change from baseline?: Pre-admission baseline Bathing: Dependent Is this a change from baseline?: Pre-admission baseline Toileting: Dependent Is this a change from baseline?: Pre-admission baseline In/Out Bed: Dependent Is this a change from baseline?: Pre-admission baseline Walks in Home: Needs assistance Is this a change from baseline?: Pre-admission baseline Does the patient have difficulty walking or climbing stairs?: Yes Weakness of Legs: Both Weakness of Arms/Hands: None  Permission Sought/Granted   Permission granted to share information with : Yes, Verbal Permission Granted              Emotional Assessment       Orientation: : Fluctuating Orientation (Suspected and/or reported Sundowners) Alcohol / Substance Use: Not Applicable Psych Involvement: No (comment)  Admission diagnosis:  Hip fracture ([place]) [S72.009A] Closed fracture of right hip, initial encounter ([place]) [S72.001A] Patient Active Problem List   Diagnosis Date Noted   Hip fracture ([place]) 07/01/2022   Dementia ([place]) 07/01/2022   Mood disorder ([place]) 07/01/2022   Hypothyroidism 07/01/2022   GERD (gastroesophageal reflux disease) 07/01/2022   Overactive bladder 07/01/2022   PALPITATIONS 09/18/2009   Hyperlipidemia 09/16/2009   CATARACTS 09/16/2009   Essential hypertension 09/16/2009   PCP:  Patient, No Pcp Per Pharmacy:   [facility]  [place], [place] - [address] [place] [address] Phone: [phone] Fax: [phone]     Social Determinants of Health (SDOH) Social History: SDOH Screenings   Food Insecurity: No Food Insecurity (07/01/2022)  Housing: Low Risk  (07/01/2022)  Transportation Needs: No Transportation Needs (07/01/2022)  Utilities: Not  At Risk (07/01/2022)  Tobacco Use: Low Risk  (07/02/2022)   SDOH Interventions:     Readmission Risk Interventions     No data to display

## 2022-07-02 NOTE — Progress Notes (Addendum)
Attempted to place another IV site, pt attempted to bite. Pulls off Leads when awake, and keeps taking off nasal canula -Mittens placed bilaterally On call provider notified

## 2022-07-02 NOTE — Progress Notes (Signed)
PROGRESS NOTE    [name]  TDD:[id number] DOB: 09-06-1923 DOA: 06/30/2022 PCP: Patient, No Pcp Per    Brief Narrative:  89 year old with history of dementia from memory care facility-now at a skilled nursing facility presented after a fall.  She was found to have a right intertrochanteric hip fracture.  Admitted to hospital.  Underwent IM nailing 2/1.  Postoperative course complicated by agitation, delirium as expected from advanced dementia.  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   Assessment & Plan:   Close traumatic right hip fracture: IM nail right hip 2/1, Dr. Duard Larsen Weightbearing, weightbearing as tolerated DVT prophylaxis, Lovenox Pain management oral and IV opiates Start mobilizing with PT OT.  Anticipate back to SNF.  Dementia with behavioral disturbances: On Depakote.  Palpitations. Precautions.  Essential hypertension: Blood pressure stable.  Hyperlipidemia: On Lipitor.  Hypothyroidism: Synthroid.    DVT prophylaxis: enoxaparin (LOVENOX) injection 30 mg Start: 07/02/22 0800 SCDs Start: 07/01/22 0106   Code Status: DNR Family Communication: Called patient's daughter.  Unable to reach. Disposition Plan: Status is: Inpatient Remains inpatient appropriate because: Immediate postop.     Consultants:  Orthopedics  Procedures:  Right hip IM nailing  Antimicrobials:  Cefazolin perioperative   Subjective: Patient seen in the morning rounds.  Sleeping quietly.  Wakes up confused, incomprehensible speech.  No other overnight events.  Objective: Vitals:   07/01/22 1745 07/01/22 1759 07/01/22 2001 07/02/22 0744  BP: 132/64 128/74 130/72 (!) 111/53  Pulse: 88 92 77 77  Resp: 12 13 15 13   Temp: (!) 97.5 F (36.4 C) 97.6 F (36.4 C) 97.7 F (36.5 C) 98.7 F (37.1 C)  TempSrc:  Oral Oral Axillary  SpO2: 96% 98% 100% 98%  Weight:      Height:        Intake/Output Summary (Last 24 hours)  at 07/02/2022 1329 Last data filed at 07/02/2022 1112 Gross per 24 hour  Intake 1150 ml  Output 50 ml  Net 1100 ml   Filed Weights   06/30/22 1959 07/01/22 1217  Weight: 50.8 kg 51 kg    Examination:  General exam: Appears calm and comfortable while asleep. Wakes up confused and impulsive. Mostly sleepy.  Alert to stimulation.  Not oriented. Respiratory system: No added sound. Cardiovascular system: S1 & S2 heard, RRR. No pedal edema. Gastrointestinal system: Soft.  Nontender.  Bowel sound present.   Extremities: Symmetric 5 x 5 power.  Right lateral thigh incision clean and dry.   Data Reviewed: I have personally reviewed following labs and imaging studies  CBC: Recent Labs  Lab 06/30/22 2214 07/01/22 0500 07/02/22 0650  WBC 10.5 10.0 7.3  NEUTROABS 9.0*  --   --   HGB 12.9 11.9* 9.4*  HCT 40.1 36.9 29.1*  MCV 92.0 93.9 93.9  PLT 219 196 623   Basic Metabolic Panel: Recent Labs  Lab 06/30/22 2214 07/01/22 0500  NA 137 138  K 3.9 3.9  CL 95* 99  CO2 29 26  GLUCOSE 148* 161*  BUN 24* 22  CREATININE 0.87 0.82  CALCIUM 8.4* 8.3*   GFR: Estimated Creatinine Clearance: 28.8 mL/min (by C-G formula based on SCr of 0.82 mg/dL). Liver Function Tests: Recent Labs  Lab 07/01/22 0500  AST 24  ALT 16  ALKPHOS 78  BILITOT 0.5  PROT 6.7  ALBUMIN 2.7*   No results for input(s): "LIPASE", "AMYLASE" in the last 168 hours. No results for input(s): "AMMONIA" in  the last 168 hours. Coagulation Profile: Recent Labs  Lab 06/30/22 2214  INR 1.1   Cardiac Enzymes: No results for input(s): "CKTOTAL", "CKMB", "CKMBINDEX", "TROPONINI" in the last 168 hours. BNP (last 3 results) No results for input(s): "PROBNP" in the last 8760 hours. HbA1C: No results for input(s): "HGBA1C" in the last 72 hours. CBG: No results for input(s): "GLUCAP" in the last 168 hours. Lipid Profile: No results for input(s): "CHOL", "HDL", "LDLCALC", "TRIG", "CHOLHDL", "LDLDIRECT" in the last 72  hours. Thyroid Function Tests: No results for input(s): "TSH", "T4TOTAL", "FREET4", "T3FREE", "THYROIDAB" in the last 72 hours. Anemia Panel: No results for input(s): "VITAMINB12", "FOLATE", "FERRITIN", "TIBC", "IRON", "RETICCTPCT" in the last 72 hours. Sepsis Labs: No results for input(s): "PROCALCITON", "LATICACIDVEN" in the last 168 hours.  No results found for this or any previous visit (from the past 240 hour(s)).       Radiology Studies: DG FEMUR PORT, MIN 2 VIEWS RIGHT  Result Date: 07/01/2022 CLINICAL DATA:  Status post ORIF of right hip fracture, initial encounter EXAM: RIGHT FEMUR PORTABLE 2 VIEW COMPARISON:  Intraoperative films from earlier in the same day. FINDINGS: Proximal medullary rod is noted in the right femur. Fixation screw is noted traversing the femoral neck. Fracture fragments are in near anatomic alignment. No other femoral abnormality is noted. IMPRESSION: Status post ORIF of proximal right femoral fracture. Electronically Signed   By: Inez Catalina M.D.   On: 07/01/2022 17:44   DG HIP UNILAT WITH PELVIS 2-3 VIEWS RIGHT  Result Date: 07/01/2022 CLINICAL DATA:  Intramedullary nail intertrochanteric right femur. Intraoperative fluoroscopy. EXAM: DG HIP (WITH OR WITHOUT PELVIS) 2-3V RIGHT COMPARISON:  Right hip radiographs 06/30/2022 FINDINGS: Images were performed intraoperatively without the presence of a radiologist. The patient is undergoing cephalomedullary nail fixation of the previously seen fracture at the junction of the distal femoral neck and proximal intertrochanteric region. No hardware complication is seen. Total fluoroscopy images: 9 Total fluoroscopy time: 64 seconds Total dose: Radiation Exposure Index (as provided by the fluoroscopic device): 8.10 mGy air Kerma Please see intraoperative findings for further detail. IMPRESSION: Intraoperative fluoroscopy for cephalomedullary nail fixation of the previously seen fracture of the distal femur. Electronically  Signed   By: Yvonne Kendall M.D.   On: 07/01/2022 16:22   DG C-Arm 1-60 Min-No Report  Result Date: 07/01/2022 Fluoroscopy was utilized by the requesting physician.  No radiographic interpretation.   DG C-Arm 1-60 Min-No Report  Result Date: 07/01/2022 Fluoroscopy was utilized by the requesting physician.  No radiographic interpretation.   DG Chest Portable 1 View  Result Date: 06/30/2022 CLINICAL DATA:  Preop for hip fracture EXAM: PORTABLE CHEST 1 VIEW COMPARISON:  None Available. FINDINGS: Airspace opacities in the right mid/lower lung are favored atelectasis/scarring. Otherwise no focal consolidation. No pleural effusion or pneumothorax. Normal cardiomediastinal silhouette. Aortic atherosclerotic calcification. No acute fracture. Degenerative arthritis both shoulders. IMPRESSION: Presumed scarring/atelectasis in the right mid/lower lung. Electronically Signed   By: Placido Sou M.D.   On: 06/30/2022 23:09   CT HEAD WO CONTRAST (5MM)  Result Date: 06/30/2022 CLINICAL DATA:  Fall, head and neck trauma. EXAM: CT HEAD WITHOUT CONTRAST CT CERVICAL SPINE WITHOUT CONTRAST TECHNIQUE: Multidetector CT imaging of the head and cervical spine was performed following the standard protocol without intravenous contrast. Multiplanar CT image reconstructions of the cervical spine were also generated. RADIATION DOSE REDUCTION: This exam was performed according to the departmental dose-optimization program which includes automated exposure control, adjustment of the mA and/or kV according  to patient size and/or use of iterative reconstruction technique. COMPARISON:  06/22/2022. FINDINGS: CT HEAD FINDINGS Brain: No acute intracranial hemorrhage, midline shift or mass effect. No extra-axial fluid collection. Diffuse atrophy is noted. Subcortical and periventricular white matter hypodensities are present bilaterally. No hydrocephalus. Vascular: No hyperdense vessel or unexpected calcification. Skull: No acute  fracture. Sinuses/Orbits: Minimal mucosal thickening in the right sphenoid sinus. No acute orbital abnormality Other: Near complete opacification of the mastoid air cells and middle ear on the right. CT CERVICAL SPINE FINDINGS Alignment: Normal. Skull base and vertebrae: No acute fracture. There is partial fusion at C4-C5. Soft tissues and spinal canal: No prevertebral fluid or swelling. No visible canal hematoma. Disc levels: Multilevel intervertebral disc space narrowing, disc osteophyte formation, and facet arthropathy. Upper chest: Biapical pleural and parenchymal thickening. Other: Carotid artery calcifications. IMPRESSION: 1. No acute intracranial process. 2. Atrophy with chronic microvascular ischemic changes. 3. Near complete opacification of the mastoid air cells and middle ear on the right, possible effusion versus cholesteatoma. 4. Multilevel degenerative changes in the cervical spine without evidence of acute fracture. Electronically Signed   By: Brett Fairy M.D.   On: 06/30/2022 21:38   CT Cervical Spine Wo Contrast  Result Date: 06/30/2022 CLINICAL DATA:  Fall, head and neck trauma. EXAM: CT HEAD WITHOUT CONTRAST CT CERVICAL SPINE WITHOUT CONTRAST TECHNIQUE: Multidetector CT imaging of the head and cervical spine was performed following the standard protocol without intravenous contrast. Multiplanar CT image reconstructions of the cervical spine were also generated. RADIATION DOSE REDUCTION: This exam was performed according to the departmental dose-optimization program which includes automated exposure control, adjustment of the mA and/or kV according to patient size and/or use of iterative reconstruction technique. COMPARISON:  06/22/2022. FINDINGS: CT HEAD FINDINGS Brain: No acute intracranial hemorrhage, midline shift or mass effect. No extra-axial fluid collection. Diffuse atrophy is noted. Subcortical and periventricular white matter hypodensities are present bilaterally. No hydrocephalus.  Vascular: No hyperdense vessel or unexpected calcification. Skull: No acute fracture. Sinuses/Orbits: Minimal mucosal thickening in the right sphenoid sinus. No acute orbital abnormality Other: Near complete opacification of the mastoid air cells and middle ear on the right. CT CERVICAL SPINE FINDINGS Alignment: Normal. Skull base and vertebrae: No acute fracture. There is partial fusion at C4-C5. Soft tissues and spinal canal: No prevertebral fluid or swelling. No visible canal hematoma. Disc levels: Multilevel intervertebral disc space narrowing, disc osteophyte formation, and facet arthropathy. Upper chest: Biapical pleural and parenchymal thickening. Other: Carotid artery calcifications. IMPRESSION: 1. No acute intracranial process. 2. Atrophy with chronic microvascular ischemic changes. 3. Near complete opacification of the mastoid air cells and middle ear on the right, possible effusion versus cholesteatoma. 4. Multilevel degenerative changes in the cervical spine without evidence of acute fracture. Electronically Signed   By: Brett Fairy M.D.   On: 06/30/2022 21:38   DG Knee Complete 4 Views Right  Result Date: 06/30/2022 CLINICAL DATA:  Fall EXAM: RIGHT KNEE - COMPLETE 4+ VIEW COMPARISON:  None Available. FINDINGS: No fracture or dislocation is seen. Mild to moderate degenerative changes in the medial and patellofemoral compartments. Visualized soft tissues are within normal limits. No suprapatellar knee joint effusion. IMPRESSION: No fracture or dislocation is seen. Mild-to-moderate degenerative changes. Electronically Signed   By: Julian Hy M.D.   On: 06/30/2022 21:25   DG Hip Unilat W or Wo Pelvis 2-3 Views Right  Result Date: 06/30/2022 CLINICAL DATA:  Fall EXAM: DG HIP (WITH OR WITHOUT PELVIS) 2-3V RIGHT COMPARISON:  None Available.  FINDINGS: Intertrochanteric right hip fracture, nondisplaced. Visualized bony pelvis appears intact. Bilateral hip joint spaces are preserved. Vascular  calcifications. IMPRESSION: Intertrochanteric right hip fracture, nondisplaced. Electronically Signed   By: Charline Bills M.D.   On: 06/30/2022 21:25        Scheduled Meds:  acetaminophen  650 mg Oral Q6H   atorvastatin  10 mg Oral Daily   divalproex  125 mg Oral BID   docusate sodium  100 mg Oral BID   enoxaparin (LOVENOX) injection  30 mg Subcutaneous Q24H   famotidine  20 mg Oral Daily   fesoterodine  4 mg Oral Daily   levothyroxine  50 mcg Oral Daily   Continuous Infusions:   ceFAZolin (ANCEF) IV Stopped (07/02/22 0149)     LOS: 1 day    Time spent: 35 minutes    Dorcas Carrow, MD [facility] Pager [phone]

## 2022-07-02 NOTE — Evaluation (Signed)
Clinical/Bedside Swallow Evaluation Patient Details  Name: [name] MRN: [id number] Date of Birth: 1924-03-31  Today's Date: 07/02/2022 Time: SLP Start Time (ACUTE ONLY): 1028 SLP Stop Time (ACUTE ONLY): 1036 SLP Time Calculation (min) (ACUTE ONLY): 8 min  Past Medical History:  Past Medical History:  Diagnosis Date   Dementia ([place])    Hypertension    Osteoarthritis    Spondylosis of cervical spine    Toxic encephalopathy    UTI (urinary tract infection)    Past Surgical History: History reviewed. No pertinent surgical history. HPI:  89 y.o. female admitted from SNF after fall, sustaining right hip fx. S/P intramedullary nailing right femur.  PMHx dementia, HTN, GERD.    Assessment / Plan / Recommendation  Clinical Impression  Pt presents with functional oropharyngeal swallowing. She was feeding herself breakfast upon arrival with supervision of NT.  Oral mechanism exam was normal. She demonstrated functional mastication of regular solids. She drank thin liquids with no s/s of aspiration.  She demonstrated no s/s of aspiration nor dysphagia. Recommend continuing current diet (regular /thin liquids). No SLP f/u is warranted - our service will sign off. SLP Visit Diagnosis: Dysphagia, unspecified (R13.10)    Aspiration Risk  No limitations    Diet Recommendation   Regular solids, thin liquids  Medication Administration: Whole meds with liquid    Other  Recommendations Oral Care Recommendations: Oral care BID    Recommendations for follow up therapy are one component of a multi-disciplinary discharge planning process, led by the attending physician.  Recommendations may be updated based on patient status, additional functional criteria and insurance authorization.  Follow up Recommendations No SLP follow up        Swallow Study   General Date of Onset: 07/01/22 HPI: 89 y.o. female admitted from SNF after fall, sustaining right hip fx. S/P intramedullary nailing right  femur.  PMHx dementia, HTN, GERD. Type of Study: Bedside Swallow Evaluation Previous Swallow Assessment: no Diet Prior to this Study: Regular;Thin liquids Temperature Spikes Noted: No Respiratory Status: Nasal cannula History of Recent Intubation: Yes Length of Intubations (days):  (for procedure only) Behavior/Cognition: Alert;Confused Oral Cavity Assessment: Within Functional Limits Oral Care Completed by SLP: No Oral Cavity - Dentition: Missing dentition Vision: Functional for self-feeding Self-Feeding Abilities: Able to feed self Patient Positioning: Upright in bed Baseline Vocal Quality: Normal Volitional Cough: Cognitively unable to elicit Volitional Swallow: Able to elicit    Oral/Motor/Sensory Function Overall Oral Motor/Sensory Function: Within functional limits   Ice Chips Ice chips: Within functional limits   Thin Liquid Thin Liquid: Within functional limits    Nectar Thick Nectar Thick Liquid: Not tested   Honey Thick Honey Thick Liquid: Not tested   Puree Puree: Within functional limits   Solid     Solid: Within functional limits      Juan Quam Laurice 07/02/2022,10:44 AM  Estill Bamberg L. Tivis Ringer, MA CCC/SLP Clinical Specialist - [place] Office number [phone]

## 2022-07-02 NOTE — Progress Notes (Signed)
Initial Nutrition Assessment  DOCUMENTATION CODES:  Not applicable  INTERVENTION:  Continue Regular diet as ordered Feeding with assistance Ensure Enlive po BID, each supplement provides 350 kcal and 20 grams of protein. MVI with minerals daily  NUTRITION DIAGNOSIS:  Increased nutrient needs related to post-op healing, hip fracture as evidenced by estimated needs.  GOAL:  Patient will meet greater than or equal to 90% of their needs   MONITOR:  PO intake, Supplement acceptance, Labs, Weight trends  REASON FOR ASSESSMENT:  Consult Assessment of nutrition requirement/status  ASSESSMENT:  Pt admitted from SNF d/t fall leading to R hip fracture. PMH significant for dementia, HTN, HLD, hypothyroidism, TIA, osteoarthritis, GERD, HOH.   02/01: s/p IMN of R hip 02/02: s/p ST BSE- able to self feed, NT supervision, oral mechanism normal, s/s aspiration or dysphagia  Pt sitting up in bed at time of visit. No family present at bedside. She has safety mitts on hands and TV on. Flowsheet documentation reflects pt to be disoriented x4. Unable to obtain detailed nutrition related history from pt at this time.   Meal completions: 2/2: 25% breakfast  Unfortunately, there is no documented weight history on file to review. Current weight noted to be 51 kg.   Edema: moderate pitting BLE  Medications: pepcid, IV drips  Labs: reviewed  NUTRITION - FOCUSED PHYSICAL EXAM: Although pt noted to have mild/moderate muscle and subcutaneous fat depletions, unable to diagnose pt with malnutrition at this time given limited nutrition and weight history. Will attempt to gather more information upon follow up and reassess as able.  Flowsheet Row Most Recent Value  Orbital Region Mild depletion  Upper Arm Region Moderate depletion  Thoracic and Lumbar Region Mild depletion  Buccal Region No depletion  Temple Region Mild depletion  Clavicle Bone Region Mild depletion  Clavicle and Acromion Bone  Region Mild depletion  Scapular Bone Region Mild depletion  Dorsal Hand Unable to assess  [in mits]  Patellar Region Moderate depletion  Anterior Thigh Region Moderate depletion  Posterior Calf Region Moderate depletion  Edema (RD Assessment) None  Hair Reviewed  Eyes Reviewed  Mouth Reviewed  Skin Reviewed  Nails Unable to assess      Diet Order:   Diet Order             Diet regular Room service appropriate? No; Fluid consistency: Thin  Diet effective now                   EDUCATION NEEDS:  No education needs have been identified at this time  Skin:  Skin Assessment: Reviewed RN Assessment (r hip (closed))  Last BM:  unknown  Height:  Ht Readings from Last 1 Encounters:  07/01/22 5' (1.524 m)   Weight:  Wt Readings from Last 1 Encounters:  07/01/22 51 kg   BMI:  Body mass index is 21.96 kg/m.  Estimated Nutritional Needs:   Kcal:  1100-1300  Protein:  60-75g  Fluid:  1.1-1.3L  Clayborne Dana, RDN, LDN Clinical Nutrition

## 2022-07-02 NOTE — Progress Notes (Signed)
Patient ID: [name], female   DOB: 21-Jun-1923, 89 y.o.   MRN: [id number]   LOS: 1 day   Subjective: Confused and deaf but pleasant this AM.   Objective: Vital signs in last 24 hours: Temp:  [97.1 F (36.2 C)-98.7 F (37.1 C)] 98.7 F (37.1 C) (02/02 0744) Pulse Rate:  [53-97] 77 (02/02 0744) Resp:  [10-18] 13 (02/02 0744) BP: (111-172)/(53-95) 111/53 (02/02 0744) SpO2:  [90 %-100 %] 98 % (02/02 0744) Weight:  [51 kg] 51 kg (02/01 1217) Last BM Date :  (UTA)   Laboratory  CBC Recent Labs    07/01/22 0500 07/02/22 0650  WBC 10.0 7.3  HGB 11.9* 9.4*  HCT 36.9 29.1*  PLT 196 183   BMET Recent Labs    06/30/22 2214 07/01/22 0500  NA 137 138  K 3.9 3.9  CL 95* 99  CO2 29 26  GLUCOSE 148* 161*  BUN 24* 22  CREATININE 0.87 0.82  CALCIUM 8.4* 8.3*     Physical Exam General appearance: no distress RLE: Dressings intact, minimal discharge. Foot motion grossly intact   Assessment/Plan: S/p IMN right femur -- PT/OT. F/u with Dr. Marcelino Scot in 2 weeks.    Lisette Abu, PA-C Orthopedic Surgery [phone] 07/02/2022

## 2022-07-03 DIAGNOSIS — K219 Gastro-esophageal reflux disease without esophagitis: Secondary | ICD-10-CM | POA: Diagnosis not present

## 2022-07-03 DIAGNOSIS — I10 Essential (primary) hypertension: Secondary | ICD-10-CM | POA: Diagnosis not present

## 2022-07-03 DIAGNOSIS — F01511 Vascular dementia, unspecified severity, with agitation: Secondary | ICD-10-CM | POA: Diagnosis not present

## 2022-07-03 DIAGNOSIS — S72001A Fracture of unspecified part of neck of right femur, initial encounter for closed fracture: Secondary | ICD-10-CM | POA: Diagnosis not present

## 2022-07-03 LAB — MISC LABCORP TEST (SEND OUT): Labcorp test code: 81950

## 2022-07-03 MED ORDER — DOCUSATE SODIUM 100 MG PO CAPS: 100.0000 mg | ORAL_CAPSULE | Freq: Two times a day (BID) | ORAL | 0 refills | Status: AC

## 2022-07-03 MED ORDER — ALPRAZOLAM 0.25 MG PO TABS: 0.2500 mg | ORAL_TABLET | ORAL | 0 refills | Status: AC

## 2022-07-03 MED ORDER — ENOXAPARIN SODIUM 30 MG/0.3ML IJ SOSY: 30.0000 mg | PREFILLED_SYRINGE | INTRAMUSCULAR | Status: AC

## 2022-07-03 MED ORDER — OXYCODONE HCL 5 MG PO TABS: 5.0000 mg | ORAL_TABLET | Freq: Four times a day (QID) | ORAL | 0 refills | Status: AC | PRN

## 2022-07-03 NOTE — TOC CAGE-AID Note (Signed)
Transition of Care [facility]) - CAGE-AID Screening   Patient Details  Name: Gordon Carlson MRN: [id number] Date of Birth: Oct 04, 1923  [facility]) CM/SW Contact:    Benard Halsted, LCSW Phone Number: 07/03/2022, 12:13 PM   Clinical Narrative: Patient disoriented and unable to participate in screening at this time.    CAGE-AID Screening: Substance Abuse Screening unable to be completed due to: : Patient unable to participate

## 2022-07-03 NOTE — Progress Notes (Signed)
Patient ID: [name], female   DOB: 21-Feb-1924, 89 y.o.   MRN: [id number]   LOS: 2 days   Subjective: Confused and deaf but pleasant this AM.   Objective: Vital signs in last 24 hours: Temp:  [98 F (36.7 C)-98.6 F (37 C)] 98.6 F (37 C) (02/03 0342) Pulse Rate:  [71] 71 (02/03 0342) BP: (117-118)/(53-69) 118/53 (02/03 0342) SpO2:  [95 %-96 %] 95 % (02/03 0342) Last BM Date :  (UTA)   Laboratory  CBC Recent Labs    07/01/22 0500 07/02/22 0650  WBC 10.0 7.3  HGB 11.9* 9.4*  HCT 36.9 29.1*  PLT 196 183   BMET Recent Labs    06/30/22 2214 07/01/22 0500  NA 137 138  K 3.9 3.9  CL 95* 99  CO2 29 26  GLUCOSE 148* 161*  BUN 24* 22  CREATININE 0.87 0.82  CALCIUM 8.4* 8.3*     Physical Exam General appearance: no distress RLE: Dressings intact, minimal discharge. Foot motion grossly intact   Assessment/Plan: S/p IMN right femur -- PT/OT. F/u with Dr. Marcelino Scot in 2 weeks. DVT prophylaxis: lovenox WBAT    Charlies Constable, MD Orthopaedic Surgery  07/03/2022

## 2022-07-03 NOTE — TOC Progression Note (Signed)
Transition of Care [facility]) - Progression Note    Patient Details  Name: [name] MRN: [id number] Date of Birth: [birth year]  [facility] CM/SW Contact  Ina Homes, [place] Phone Number: 07/03/2022, 9:46 AM  Clinical Narrative:     SW spoke with Sheran Lawless [facility] [phone]) reports d/c Monday would be preferred due to pt's behaviors and new hip fracture. They do not have the appropriate staff or PT assistance on the weekend and do not want pt to re-injury herself.  MD updated   Expected Discharge Plan: [place] Barriers to Discharge: Continued Medical Work up  Expected Discharge Plan and [place] arrangements for the past 2 months: [place], [place]                                       Social Determinants of Health (SDOH) Interventions SDOH Screenings   Food Insecurity: No Food Insecurity (07/01/2022)  Housing: Low Risk  (07/01/2022)  Transportation Needs: No Transportation Needs (07/01/2022)  Utilities: Not At Risk (07/01/2022)  Tobacco Use: Low Risk  (07/02/2022)    Readmission Risk Interventions     No data to display

## 2022-07-03 NOTE — Progress Notes (Signed)
PROGRESS NOTE    [name]  OEV:[id number] DOB: [birth year] DOA: 06/30/2022 PCP: Patient, No Pcp Per    Brief Narrative:  89 year old with history of dementia from memory care facility-now at a skilled nursing facility presented after a fall.  She was found to have a right intertrochanteric hip fracture.  Admitted to hospital.  Underwent IM nailing 2/1.  Postoperative course complicated by agitation, delirium as expected from advanced dementia.  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   Assessment & Plan:   Close traumatic right hip fracture: IM nail right hip 2/1, Dr. Marcelino Scot Weightbearing, weightbearing as tolerated DVT prophylaxis, Lovenox will prescribe for 4 weeks. Pain management oral and IV opiates.  Use oral opiates with bowel regimen. mobilizing with PT OT.  Anticipate back to SNF.  Dementia with behavioral disturbances: On Depakote.  Delirium precautions.  Essential hypertension: Blood pressure stable.  Hyperlipidemia: On Lipitor.  Hypothyroidism: Synthroid.  Patient medically stable.  She has to go back to [facility].  They reported that patient needs very close monitoring, dementia care and they do not have enough staffing on the weekend to take the patient.  Plan for discharge on Monday.    DVT prophylaxis: enoxaparin (LOVENOX) injection 30 mg Start: 07/02/22 0800 SCDs Start: 07/01/22 0106   Code Status: DNR Family Communication: Updated patient's daughter on the phone. Disposition Plan: Status is: Inpatient Remains inpatient appropriate because: Unsafe discharge today.     Consultants:  Orthopedics  Procedures:  Right hip IM nailing  Antimicrobials:  Cefazolin perioperative   Subjective:  Patient seen and examined.  Sleepy.  On further questioning she denies any complaints.  Looks comfortable.  No other overnight events.  Objective: Vitals:   07/02/22 0744 07/02/22 2016 07/03/22  0342 07/03/22 0900  BP: (!) 111/53 117/69 (!) 118/53 114/63  Pulse: 77  71 80  Resp: 13   16  Temp: 98.7 F (37.1 C) 98 F (36.7 C) 98.6 F (37 C) 98.2 F (36.8 C)  TempSrc: Axillary Oral Oral Oral  SpO2: 98% 96% 95% 96%  Weight:      Height:       No intake or output data in the 24 hours ending 07/03/22 1153  Filed Weights   06/30/22 1959 07/01/22 1217  Weight: 50.8 kg 51 kg    Examination:  General exam: Appears calm and comfortable at rest. Alert to stimulation.  Not oriented. Respiratory system: No added sound. Cardiovascular system: S1 & S2 heard, RRR. No pedal edema. Gastrointestinal system: Soft.  Nontender.  Bowel sound present.   Extremities: Symmetric 5 x 5 power.  Right lateral thigh incision clean and dry.   Data Reviewed: I have personally reviewed following labs and imaging studies  CBC: Recent Labs  Lab 06/30/22 2214 07/01/22 0500 07/02/22 0650  WBC 10.5 10.0 7.3  NEUTROABS 9.0*  --   --   HGB 12.9 11.9* 9.4*  HCT 40.1 36.9 29.1*  MCV 92.0 93.9 93.9  PLT 219 196 829   Basic Metabolic Panel: Recent Labs  Lab 06/30/22 2214 07/01/22 0500  NA 137 138  K 3.9 3.9  CL 95* 99  CO2 29 26  GLUCOSE 148* 161*  BUN 24* 22  CREATININE 0.87 0.82  CALCIUM 8.4* 8.3*   GFR: Estimated Creatinine Clearance: 28.8 mL/min (by C-G formula based on SCr of 0.82 mg/dL). Liver Function Tests: Recent Labs  Lab 07/01/22 0500  AST 24  ALT 16  ALKPHOS 78  BILITOT 0.5  PROT 6.7  ALBUMIN 2.7*   No results for input(s): "LIPASE", "AMYLASE" in the last 168 hours. No results for input(s): "AMMONIA" in the last 168 hours. Coagulation Profile: Recent Labs  Lab 06/30/22 2214  INR 1.1   Cardiac Enzymes: No results for input(s): "CKTOTAL", "CKMB", "CKMBINDEX", "TROPONINI" in the last 168 hours. BNP (last 3 results) No results for input(s): "PROBNP" in the last 8760 hours. HbA1C: No results for input(s): "HGBA1C" in the last 72 hours. CBG: No results for  input(s): "GLUCAP" in the last 168 hours. Lipid Profile: No results for input(s): "CHOL", "HDL", "LDLCALC", "TRIG", "CHOLHDL", "LDLDIRECT" in the last 72 hours. Thyroid Function Tests: No results for input(s): "TSH", "T4TOTAL", "FREET4", "T3FREE", "THYROIDAB" in the last 72 hours. Anemia Panel: No results for input(s): "VITAMINB12", "FOLATE", "FERRITIN", "TIBC", "IRON", "RETICCTPCT" in the last 72 hours. Sepsis Labs: No results for input(s): "PROCALCITON", "LATICACIDVEN" in the last 168 hours.  No results found for this or any previous visit (from the past 240 hour(s)).       Radiology Studies: DG FEMUR PORT, MIN 2 VIEWS RIGHT  Result Date: 07/01/2022 CLINICAL DATA:  Status post ORIF of right hip fracture, initial encounter EXAM: RIGHT FEMUR PORTABLE 2 VIEW COMPARISON:  Intraoperative films from earlier in the same day. FINDINGS: Proximal medullary rod is noted in the right femur. Fixation screw is noted traversing the femoral neck. Fracture fragments are in near anatomic alignment. No other femoral abnormality is noted. IMPRESSION: Status post ORIF of proximal right femoral fracture. Electronically Signed   By: Inez Catalina M.D.   On: 07/01/2022 17:44   DG HIP UNILAT WITH PELVIS 2-3 VIEWS RIGHT  Result Date: 07/01/2022 CLINICAL DATA:  Intramedullary nail intertrochanteric right femur. Intraoperative fluoroscopy. EXAM: DG HIP (WITH OR WITHOUT PELVIS) 2-3V RIGHT COMPARISON:  Right hip radiographs 06/30/2022 FINDINGS: Images were performed intraoperatively without the presence of a radiologist. The patient is undergoing cephalomedullary nail fixation of the previously seen fracture at the junction of the distal femoral neck and proximal intertrochanteric region. No hardware complication is seen. Total fluoroscopy images: 9 Total fluoroscopy time: 64 seconds Total dose: Radiation Exposure Index (as provided by the fluoroscopic device): 8.10 mGy air Kerma Please see intraoperative findings for  further detail. IMPRESSION: Intraoperative fluoroscopy for cephalomedullary nail fixation of the previously seen fracture of the distal femur. Electronically Signed   By: Yvonne Kendall M.D.   On: 07/01/2022 16:22   DG C-Arm 1-60 Min-No Report  Result Date: 07/01/2022 Fluoroscopy was utilized by the requesting physician.  No radiographic interpretation.   DG C-Arm 1-60 Min-No Report  Result Date: 07/01/2022 Fluoroscopy was utilized by the requesting physician.  No radiographic interpretation.        Scheduled Meds:  acetaminophen  650 mg Oral Q6H   atorvastatin  10 mg Oral Daily   divalproex  125 mg Oral BID   docusate sodium  100 mg Oral BID   enoxaparin (LOVENOX) injection  30 mg Subcutaneous Q24H   famotidine  20 mg Oral Daily   feeding supplement  237 mL Oral BID BM   fesoterodine  4 mg Oral Daily   levothyroxine  50 mcg Oral Daily   multivitamin with minerals  1 tablet Oral Daily   Continuous Infusions:     LOS: 2 days    Time spent: 35 minutes    Barb Merino, MD [facility] Pager [phone]

## 2022-07-04 DIAGNOSIS — S72001A Fracture of unspecified part of neck of right femur, initial encounter for closed fracture: Secondary | ICD-10-CM | POA: Diagnosis not present

## 2022-07-04 DIAGNOSIS — F01511 Vascular dementia, unspecified severity, with agitation: Secondary | ICD-10-CM | POA: Diagnosis not present

## 2022-07-04 DIAGNOSIS — I10 Essential (primary) hypertension: Secondary | ICD-10-CM | POA: Diagnosis not present

## 2022-07-04 DIAGNOSIS — K219 Gastro-esophageal reflux disease without esophagitis: Secondary | ICD-10-CM | POA: Diagnosis not present

## 2022-07-04 NOTE — Progress Notes (Signed)
Patient's daugter would like to be contacted when she is discharged.  Most likely discharged tomorrow (07/05/22)

## 2022-07-04 NOTE — Progress Notes (Signed)
PROGRESS NOTE    [name]  KNL:[id number] DOB: 09-12-1923 DOA: 06/30/2022 PCP: Patient, No Pcp Per    Brief Narrative:  89 year old with history of dementia from memory care facility-now at a skilled nursing facility presented after a fall.  She was found to have a right intertrochanteric hip fracture.  Admitted to hospital.  Underwent IM nailing 2/1.  Postoperative course complicated by agitation, delirium as expected from advanced dementia.  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   Assessment & Plan:   Close traumatic right hip fracture: IM nail right hip 2/1, Dr. Marcelino Scot Weightbearing, weightbearing as tolerated DVT prophylaxis, Lovenox will prescribe for 4 weeks. Pain management with oral opiates today.  Use oral opiates with bowel regimen. mobilizing with PT OT.  Anticipate back to SNF.  Dementia with behavioral disturbances: On Depakote.  Delirium precautions.  Essential hypertension: Blood pressure stable.  Hyperlipidemia: On Lipitor.  Hypothyroidism: Synthroid.  Patient medically stable.  She has to go back to [facility].  Nursing home reported not having enough staff today as patient is postop and also gets delirious.  Plan to discharge for tomorrow morning.      DVT prophylaxis: enoxaparin (LOVENOX) injection 30 mg Start: 07/02/22 0800 SCDs Start: 07/01/22 0106   Code Status: DNR Family Communication: Updated patient's daughter on the phone 2/3. Disposition Plan: Status is: Inpatient Remains inpatient appropriate because: Unsafe discharge today.     Consultants:  Orthopedics  Procedures:  Right hip IM nailing  Antimicrobials:  Cefazolin perioperative   Subjective:  Seen and examined.  No overnight events reported.  Remains calm and quiet.  Currently sleepy.  Objective: Vitals:   07/03/22 0900 07/03/22 1525 07/03/22 2003 07/04/22 0800  BP: 114/63  (!) 161/61 (!) 123/50  Pulse:  80 87  75  Resp: 16   14  Temp: 98.2 F (36.8 C) 97.8 F (36.6 C)  98.1 F (36.7 C)  TempSrc: Oral Oral  Oral  SpO2: 96% 98%  98%  Weight:      Height:        Intake/Output Summary (Last 24 hours) at 07/04/2022 1126 Last data filed at 07/04/2022 0114 Gross per 24 hour  Intake --  Output 800 ml  Net -800 ml    Filed Weights   06/30/22 1959 07/01/22 1217  Weight: 50.8 kg 51 kg    Examination:  General exam: Patient is sleepy.  Looks comfortable.  Took morning medications. Respiratory system: No added sound. Cardiovascular system: S1 & S2 heard, RRR. No pedal edema. Gastrointestinal system: Soft.  Nontender.  Bowel sound present.   Extremities: Symmetric 5 x 5 power.  Right lateral thigh incision clean and dry.   Data Reviewed: I have personally reviewed following labs and imaging studies  CBC: Recent Labs  Lab 06/30/22 2214 07/01/22 0500 07/02/22 0650  WBC 10.5 10.0 7.3  NEUTROABS 9.0*  --   --   HGB 12.9 11.9* 9.4*  HCT 40.1 36.9 29.1*  MCV 92.0 93.9 93.9  PLT 219 196 790   Basic Metabolic Panel: Recent Labs  Lab 06/30/22 2214 07/01/22 0500  NA 137 138  K 3.9 3.9  CL 95* 99  CO2 29 26  GLUCOSE 148* 161*  BUN 24* 22  CREATININE 0.87 0.82  CALCIUM 8.4* 8.3*   GFR: Estimated Creatinine Clearance: 28.8 mL/min (by C-G formula based on SCr of 0.82 mg/dL). Liver Function Tests: Recent Labs  Lab 07/01/22 0500  AST  24  ALT 16  ALKPHOS 78  BILITOT 0.5  PROT 6.7  ALBUMIN 2.7*   No results for input(s): "LIPASE", "AMYLASE" in the last 168 hours. No results for input(s): "AMMONIA" in the last 168 hours. Coagulation Profile: Recent Labs  Lab 06/30/22 2214  INR 1.1   Cardiac Enzymes: No results for input(s): "CKTOTAL", "CKMB", "CKMBINDEX", "TROPONINI" in the last 168 hours. BNP (last 3 results) No results for input(s): "PROBNP" in the last 8760 hours. HbA1C: No results for input(s): "HGBA1C" in the last 72 hours. CBG: No results for input(s):  "GLUCAP" in the last 168 hours. Lipid Profile: No results for input(s): "CHOL", "HDL", "LDLCALC", "TRIG", "CHOLHDL", "LDLDIRECT" in the last 72 hours. Thyroid Function Tests: No results for input(s): "TSH", "T4TOTAL", "FREET4", "T3FREE", "THYROIDAB" in the last 72 hours. Anemia Panel: No results for input(s): "VITAMINB12", "FOLATE", "FERRITIN", "TIBC", "IRON", "RETICCTPCT" in the last 72 hours. Sepsis Labs: No results for input(s): "PROCALCITON", "LATICACIDVEN" in the last 168 hours.  No results found for this or any previous visit (from the past 240 hour(s)).       Radiology Studies: No results found.      Scheduled Meds:  acetaminophen  650 mg Oral Q6H   atorvastatin  10 mg Oral Daily   divalproex  125 mg Oral BID   docusate sodium  100 mg Oral BID   enoxaparin (LOVENOX) injection  30 mg Subcutaneous Q24H   famotidine  20 mg Oral Daily   feeding supplement  237 mL Oral BID BM   fesoterodine  4 mg Oral Daily   levothyroxine  50 mcg Oral Daily   multivitamin with minerals  1 tablet Oral Daily   Continuous Infusions:     LOS: 3 days    Time spent: 35 minutes    Barb Merino, MD [facility] Pager [phone]

## 2022-07-04 NOTE — Progress Notes (Signed)
Patient tolerated tylenol today with no combative episodes.  Complained of mild pain but was found sleeping multiple times

## 2022-07-05 DIAGNOSIS — F02B3 Dementia in other diseases classified elsewhere, moderate, with mood disturbance: Secondary | ICD-10-CM

## 2022-07-05 DIAGNOSIS — E02 Subclinical iodine-deficiency hypothyroidism: Secondary | ICD-10-CM | POA: Diagnosis not present

## 2022-07-05 DIAGNOSIS — S72001A Fracture of unspecified part of neck of right femur, initial encounter for closed fracture: Secondary | ICD-10-CM | POA: Diagnosis not present

## 2022-07-05 NOTE — TOC Progression Note (Signed)
Transition of Care [facility]) - Progression Note    Patient Details  Name: [name] MRN: [id number] Date of Birth: 11/20/1923  Transition of Care [facility]) CM/SW [place], [place] Phone Number: 07/05/2022, 10:15 AM  Clinical Narrative:     CSW confirmed with [facility] that pt can admit to their SNF today under LTC. [facility] liaison inquired about auth for rehab services; Josem Kaufmann is pending and Everlene Balls is requesting details of PLOF and prior living situation. SNF liaison informs CSW that pt was previously at [facility] ALF level of care and went to their SNF for LTC and was there for a few hours before admitting to the hospital. She was confused, needing assistance with ADLs. Mostly used a wheelchair but could walk short distances. CSW updated [facility] via online portal with this information. We do not need to wait on auth for pt to discharge today. CSW will coordinate DC once DC summary is completed.   Expected Discharge Plan: [place] Barriers to Discharge: Continued Medical Work up  Expected Discharge Plan and Services       Living arrangements for the past 2 months: [place], [place] Expected Discharge Date: 07/05/22                                     Social Determinants of Health (SDOH) Interventions SDOH Screenings   Food Insecurity: No Food Insecurity (07/01/2022)  Housing: Low Risk  (07/01/2022)  Transportation Needs: No Transportation Needs (07/01/2022)  Utilities: Not At Risk (07/01/2022)  Tobacco Use: Low Risk  (07/02/2022)    Readmission Risk Interventions     No data to display

## 2022-07-05 NOTE — TOC Transition Note (Signed)
Transition of Care [facility]) - CM/SW Discharge Note   Patient Details  Name: [name] MRN: [id number] Date of Birth: Oct 26, 1923  Transition [facility]) CM/SW Contact:  Bethann Berkshire, [place] Phone Number: 07/05/2022, 11:32 AM   Clinical Narrative:     Patient will DC to: [facility] SNF Anticipated DC date: 07/05/22 Family notified: Daughter Gregary Signs Transport by: Corey Harold   Per MD patient ready for DC to [facility]. RN, patient, patient's family, and facility notified of DC. Discharge Summary and FL2 sent to facility. RN to call report prior to discharge [phone] Room 51B). DC packet on chart. Ambulance transport requested for patient.   CSW will sign off for now as social work intervention is no longer needed. Please consult [place] again if new needs arise.   Final next level of care: [place] Barriers to Discharge: No Barriers Identified   Patient Goals and CMS Choice   Choice offered to / list presented to : NA  Discharge Placement                Patient chooses bed at: [facility] Patient to be transferred to facility by: [place] Name of family member notified: Daughter Gregary Signs Patient and family notified of of transfer: 07/05/22  Discharge Plan and Services Additional resources added to the After Visit Summary for                                       Social Determinants of Health (SDOH) Interventions SDOH Screenings   Food Insecurity: No Food Insecurity (07/01/2022)  Housing: Low Risk  (07/01/2022)  Transportation Needs: No Transportation Needs (07/01/2022)  Utilities: Not At Risk (07/01/2022)  Tobacco Use: Low Risk  (07/02/2022)     Readmission Risk Interventions     No data to display

## 2022-07-05 NOTE — Discharge Summary (Signed)
Physician Discharge Summary  [name] SAY:[id number] DOB: 19-Apr-1924 DOA: 06/30/2022  PCP: Patient, No Pcp Per  Admit date: 06/30/2022 Discharge date: 07/05/2022  Admitted From: Skilled nursing facility Disposition: Skilled nursing facility  Recommendations for Outpatient Follow-up:  Follow up with PCP in 1-2 weeks after discharge. Orthopedics will schedule follow-up.  Discharge Condition: Fair CODE STATUS: DNR Diet recommendation: Regular diet, supplemental nutrition   Discharge summary: 89 year old with history of dementia from memory care facility-now at a skilled nursing facility presented after a fall.  She was found to have a right intertrochanteric hip fracture.  Admitted to hospital.  Underwent IM nailing 2/1.  Postoperative course complicated by agitation, delirium as expected from advanced dementia.  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  Closed traumatic right hip fracture: IM nail right hip 2/1, Dr. Carola Frost Weightbearing, weightbearing as tolerated DVT prophylaxis, Lovenox will prescribe for 4 weeks. Pain management with oral opiates. Use oral opiates with bowel regimen. mobilizing with PT OT.  Anticipate back to SNF.   Dementia with behavioral disturbances: On Depakote.  Delirium precautions.   Essential hypertension: Blood pressure stable.   Hyperlipidemia: On Lipitor.   Hypothyroidism: Synthroid.   Patient medically stable.  She is able to go back to skilled nursing facility as long-term care today.  High risk of delirium due to underlying dementia.   Discharge Diagnoses:  Principal Problem:   Hip fracture (HCC) Active Problems:   Hyperlipidemia   Essential hypertension   Dementia (HCC)   Mood disorder (HCC)   Hypothyroidism   GERD (gastroesophageal reflux disease)   Overactive bladder    Discharge Instructions  Discharge Instructions     Diet general   Complete by: As directed     Increase activity slowly   Complete by: As directed       Allergies as of 07/05/2022       Reactions   Chlorthalidone Nausea And Vomiting   Metoprolol Succinate Other (See Comments)   Reaction unconfirmed        Medication List     TAKE these medications    acetaminophen 325 MG tablet Commonly known as: TYLENOL Take 650 mg by mouth every 4 (four) hours as needed for mild pain (or a fever of 100 F or greater).   ALPRAZolam 0.25 MG tablet Commonly known as: XANAX Take 1 tablet (0.25 mg total) by mouth See admin instructions for 5 days. Take 0.25 mg by mouth 30 minutes prior to baths on Mondays and Thursdays AND once a day as needed for agitation or anxiety   aspirin 81 MG chewable tablet Chew 81 mg by mouth daily.   atorvastatin 10 MG tablet Commonly known as: LIPITOR Take 10 mg by mouth daily.   divalproex 125 MG DR tablet Commonly known as: DEPAKOTE Take 125 mg by mouth 2 (two) times daily.   docusate sodium 100 MG capsule Commonly known as: COLACE Take 1 capsule (100 mg total) by mouth 2 (two) times daily.   enoxaparin 30 MG/0.3ML injection Commonly known as: LOVENOX Inject 0.3 mLs (30 mg total) into the skin daily for 28 days.   famotidine 20 MG tablet Commonly known as: PEPCID Take 20 mg by mouth daily.   furosemide 40 MG tablet Commonly known as: LASIX Take 40 mg by mouth in the morning.   furosemide 20 MG tablet Commonly known as: LASIX Take 20 mg by mouth every evening.   levothyroxine 50 MCG tablet Commonly known  as: SYNTHROID Take 50 mcg by mouth daily.   Melatonin 5 MG Chew Chew 5 mg by mouth at bedtime as needed (for insomnia).   Mucinex 600 MG 12 hr tablet Generic drug: guaiFENesin Take 600 mg by mouth every 12 (twelve) hours as needed for cough.   ondansetron 4 MG disintegrating tablet Commonly known as: ZOFRAN-ODT Take 4 mg by mouth every 4 (four) hours as needed for nausea or vomiting.   oxyCODONE 5 MG immediate release  tablet Commonly known as: Oxy IR/ROXICODONE Take 1 tablet (5 mg total) by mouth every 6 (six) hours as needed for up to 5 days for severe pain.   polyethylene glycol powder 17 GM/SCOOP powder Commonly known as: GLYCOLAX/MIRALAX Take 17 g by mouth daily as needed for mild constipation.   QC Vitamin D3 50 MCG (2000 UT) Caps Generic drug: Cholecalciferol Take 2,000 Units by mouth daily.   tolterodine 4 MG 24 hr capsule Commonly known as: DETROL LA Take 4 mg by mouth daily.   zinc oxide 20 % ointment Apply 1 Application topically See admin instructions. Apply to buttocks every shift        Allergies  Allergen Reactions   Chlorthalidone Nausea And Vomiting   Metoprolol Succinate Other (See Comments)    Reaction unconfirmed    Consultations: Orthopedics   Procedures/Studies: DG FEMUR PORT, MIN 2 VIEWS RIGHT  Result Date: 07/01/2022 CLINICAL DATA:  Status post ORIF of right hip fracture, initial encounter EXAM: RIGHT FEMUR PORTABLE 2 VIEW COMPARISON:  Intraoperative films from earlier in the same day. FINDINGS: Proximal medullary rod is noted in the right femur. Fixation screw is noted traversing the femoral neck. Fracture fragments are in near anatomic alignment. No other femoral abnormality is noted. IMPRESSION: Status post ORIF of proximal right femoral fracture. Electronically Signed   By: Inez Catalina M.D.   On: 07/01/2022 17:44   DG HIP UNILAT WITH PELVIS 2-3 VIEWS RIGHT  Result Date: 07/01/2022 CLINICAL DATA:  Intramedullary nail intertrochanteric right femur. Intraoperative fluoroscopy. EXAM: DG HIP (WITH OR WITHOUT PELVIS) 2-3V RIGHT COMPARISON:  Right hip radiographs 06/30/2022 FINDINGS: Images were performed intraoperatively without the presence of a radiologist. The patient is undergoing cephalomedullary nail fixation of the previously seen fracture at the junction of the distal femoral neck and proximal intertrochanteric region. No hardware complication is seen. Total  fluoroscopy images: 9 Total fluoroscopy time: 64 seconds Total dose: Radiation Exposure Index (as provided by the fluoroscopic device): 8.10 mGy air Kerma Please see intraoperative findings for further detail. IMPRESSION: Intraoperative fluoroscopy for cephalomedullary nail fixation of the previously seen fracture of the distal femur. Electronically Signed   By: Yvonne Kendall M.D.   On: 07/01/2022 16:22   DG C-Arm 1-60 Min-No Report  Result Date: 07/01/2022 Fluoroscopy was utilized by the requesting physician.  No radiographic interpretation.   DG C-Arm 1-60 Min-No Report  Result Date: 07/01/2022 Fluoroscopy was utilized by the requesting physician.  No radiographic interpretation.   DG Chest Portable 1 View  Result Date: 06/30/2022 CLINICAL DATA:  Preop for hip fracture EXAM: PORTABLE CHEST 1 VIEW COMPARISON:  None Available. FINDINGS: Airspace opacities in the right mid/lower lung are favored atelectasis/scarring. Otherwise no focal consolidation. No pleural effusion or pneumothorax. Normal cardiomediastinal silhouette. Aortic atherosclerotic calcification. No acute fracture. Degenerative arthritis both shoulders. IMPRESSION: Presumed scarring/atelectasis in the right mid/lower lung. Electronically Signed   By: Placido Sou M.D.   On: 06/30/2022 23:09   CT HEAD WO CONTRAST (5MM)  Result Date: 06/30/2022 CLINICAL  DATA:  Fall, head and neck trauma. EXAM: CT HEAD WITHOUT CONTRAST CT CERVICAL SPINE WITHOUT CONTRAST TECHNIQUE: Multidetector CT imaging of the head and cervical spine was performed following the standard protocol without intravenous contrast. Multiplanar CT image reconstructions of the cervical spine were also generated. RADIATION DOSE REDUCTION: This exam was performed according to the departmental dose-optimization program which includes automated exposure control, adjustment of the mA and/or kV according to patient size and/or use of iterative reconstruction technique. COMPARISON:   06/22/2022. FINDINGS: CT HEAD FINDINGS Brain: No acute intracranial hemorrhage, midline shift or mass effect. No extra-axial fluid collection. Diffuse atrophy is noted. Subcortical and periventricular white matter hypodensities are present bilaterally. No hydrocephalus. Vascular: No hyperdense vessel or unexpected calcification. Skull: No acute fracture. Sinuses/Orbits: Minimal mucosal thickening in the right sphenoid sinus. No acute orbital abnormality Other: Near complete opacification of the mastoid air cells and middle ear on the right. CT CERVICAL SPINE FINDINGS Alignment: Normal. Skull base and vertebrae: No acute fracture. There is partial fusion at C4-C5. Soft tissues and spinal canal: No prevertebral fluid or swelling. No visible canal hematoma. Disc levels: Multilevel intervertebral disc space narrowing, disc osteophyte formation, and facet arthropathy. Upper chest: Biapical pleural and parenchymal thickening. Other: Carotid artery calcifications. IMPRESSION: 1. No acute intracranial process. 2. Atrophy with chronic microvascular ischemic changes. 3. Near complete opacification of the mastoid air cells and middle ear on the right, possible effusion versus cholesteatoma. 4. Multilevel degenerative changes in the cervical spine without evidence of acute fracture. Electronically Signed   By: Brett Fairy M.D.   On: 06/30/2022 21:38   CT Cervical Spine Wo Contrast  Result Date: 06/30/2022 CLINICAL DATA:  Fall, head and neck trauma. EXAM: CT HEAD WITHOUT CONTRAST CT CERVICAL SPINE WITHOUT CONTRAST TECHNIQUE: Multidetector CT imaging of the head and cervical spine was performed following the standard protocol without intravenous contrast. Multiplanar CT image reconstructions of the cervical spine were also generated. RADIATION DOSE REDUCTION: This exam was performed according to the departmental dose-optimization program which includes automated exposure control, adjustment of the mA and/or kV according to  patient size and/or use of iterative reconstruction technique. COMPARISON:  06/22/2022. FINDINGS: CT HEAD FINDINGS Brain: No acute intracranial hemorrhage, midline shift or mass effect. No extra-axial fluid collection. Diffuse atrophy is noted. Subcortical and periventricular white matter hypodensities are present bilaterally. No hydrocephalus. Vascular: No hyperdense vessel or unexpected calcification. Skull: No acute fracture. Sinuses/Orbits: Minimal mucosal thickening in the right sphenoid sinus. No acute orbital abnormality Other: Near complete opacification of the mastoid air cells and middle ear on the right. CT CERVICAL SPINE FINDINGS Alignment: Normal. Skull base and vertebrae: No acute fracture. There is partial fusion at C4-C5. Soft tissues and spinal canal: No prevertebral fluid or swelling. No visible canal hematoma. Disc levels: Multilevel intervertebral disc space narrowing, disc osteophyte formation, and facet arthropathy. Upper chest: Biapical pleural and parenchymal thickening. Other: Carotid artery calcifications. IMPRESSION: 1. No acute intracranial process. 2. Atrophy with chronic microvascular ischemic changes. 3. Near complete opacification of the mastoid air cells and middle ear on the right, possible effusion versus cholesteatoma. 4. Multilevel degenerative changes in the cervical spine without evidence of acute fracture. Electronically Signed   By: Brett Fairy M.D.   On: 06/30/2022 21:38   DG Knee Complete 4 Views Right  Result Date: 06/30/2022 CLINICAL DATA:  Fall EXAM: RIGHT KNEE - COMPLETE 4+ VIEW COMPARISON:  None Available. FINDINGS: No fracture or dislocation is seen. Mild to moderate degenerative changes in the medial  and patellofemoral compartments. Visualized soft tissues are within normal limits. No suprapatellar knee joint effusion. IMPRESSION: No fracture or dislocation is seen. Mild-to-moderate degenerative changes. Electronically Signed   By: Julian Hy M.D.   On:  06/30/2022 21:25   DG Hip Unilat W or Wo Pelvis 2-3 Views Right  Result Date: 06/30/2022 CLINICAL DATA:  Fall EXAM: DG HIP (WITH OR WITHOUT PELVIS) 2-3V RIGHT COMPARISON:  None Available. FINDINGS: Intertrochanteric right hip fracture, nondisplaced. Visualized bony pelvis appears intact. Bilateral hip joint spaces are preserved. Vascular calcifications. IMPRESSION: Intertrochanteric right hip fracture, nondisplaced. Electronically Signed   By: Julian Hy M.D.   On: 06/30/2022 21:25   DG Hand Complete Left  Result Date: 06/22/2022 CLINICAL DATA:  Fall, left hand bruise EXAM: LEFT HAND - COMPLETE 3+ VIEW COMPARISON:  None Available. FINDINGS: Remote fracture deformity of the distal left radius with residual loss of radial inclination and ulnar positive variance. Widening of the scapholunate interval in keeping with disruption of the scapholunate ligament. Volar tilt of the lunate suggesting a VISI type configuration. Advanced degenerative changes are noted at the DIP and PIP joints of the fingers, interphalangeal joint of the thumb, and first MCP and CMC joints of the base of the thumb. Milder degenerative changes are noted at the triscaphe joint. There is soft tissue swelling dorsal to the carpus. IMPRESSION: 1. Soft tissue swelling. No acute fracture or dislocation. 2. Remote fracture deformity of the distal left radius with residual loss of radial inclination and ulnar positive variance. Electronically Signed   By: Fidela Salisbury M.D.   On: 06/22/2022 23:54   CT Head Wo Contrast  Result Date: 06/22/2022 CLINICAL DATA:  Dementia wounds EXAM: CT HEAD WITHOUT CONTRAST TECHNIQUE: Contiguous axial images were obtained from the base of the skull through the vertex without intravenous contrast. RADIATION DOSE REDUCTION: This exam was performed according to the departmental dose-optimization program which includes automated exposure control, adjustment of the mA and/or kV according to patient size and/or  use of iterative reconstruction technique. COMPARISON:  CT 09/14/2007 FINDINGS: Brain: No acute territorial infarction, hemorrhage or intracranial mass. Moderate atrophy. Mild patchy white matter hypodensity likely chronic small vessel ischemic change. Nonenlarged ventricles. Vascular: No hyperdense vessels.  No unexpected calcification Skull: Normal. Negative for fracture or focal lesion. Sinuses/Orbits: No acute finding. Other: None IMPRESSION: 1. No CT evidence for acute intracranial abnormality. 2. Atrophy and mild chronic small vessel ischemic changes of the white matter. Electronically Signed   By: Donavan Foil M.D.   On: 06/22/2022 23:52   DG Knee Complete 4 Views Left  Result Date: 06/22/2022 CLINICAL DATA:  Fall, dementia, left knee pain EXAM: LEFT KNEE - COMPLETE 4+ VIEW COMPARISON:  None Available. FINDINGS: Normal alignment. No acute fracture or dislocation. Mild to moderate lateral compartment degenerative arthritis. Small left knee effusion. Vascular calcifications are noted. IMPRESSION: 1. Mild to moderate lateral compartment degenerative arthritis. Small left knee effusion. Electronically Signed   By: Fidela Salisbury M.D.   On: 06/22/2022 23:50   (Echo, Carotid, EGD, Colonoscopy, ERCP)    Subjective: Patient seen in the morning rounds.  Sleeping quietly.  No overnight events.  Remained quiet and composed last 24 hours.   Discharge Exam: Vitals:   07/05/22 0644 07/05/22 0801  BP: (!) 164/68 (!) 152/57  Pulse: 78 72  Resp: 16 18  Temp: 98.2 F (36.8 C) 98.2 F (36.8 C)  SpO2: 98% 94%   Vitals:   07/04/22 0800 07/04/22 1500 07/05/22 0644 07/05/22 0801  BP: (!) 123/50 (!) 126/54 (!) 164/68 (!) 152/57  Pulse: 75 78 78 72  Resp: 14 14 16 18   Temp: 98.1 F (36.7 C) 98.5 F (36.9 C) 98.2 F (36.8 C) 98.2 F (36.8 C)  TempSrc: Oral Oral Oral Oral  SpO2: 98% 97% 98% 94%  Weight:      Height:        General: Pt is alert, awake, not in acute distress Pleasant.  Confused.   Cannot keep up conversation.  Not interactive. Cardiovascular: RRR, S1/S2 +, no rubs, no gallops Respiratory: CTA bilaterally, no wheezing, no rhonchi Abdominal: Soft, NT, ND, bowel sounds + Extremities: no edema, no cyanosis Right thigh incision clean and dry.    The results of significant diagnostics from this hospitalization (including imaging, microbiology, ancillary and laboratory) are listed below for reference.     Microbiology: No results found for this or any previous visit (from the past 240 hour(s)).   Labs: BNP (last 3 results) Recent Labs    07/01/22 0500  BNP 146.8*   Basic Metabolic Panel: Recent Labs  Lab 06/30/22 2214 07/01/22 0500  NA 137 138  K 3.9 3.9  CL 95* 99  CO2 29 26  GLUCOSE 148* 161*  BUN 24* 22  CREATININE 0.87 0.82  CALCIUM 8.4* 8.3*   Liver Function Tests: Recent Labs  Lab 07/01/22 0500  AST 24  ALT 16  ALKPHOS 78  BILITOT 0.5  PROT 6.7  ALBUMIN 2.7*   No results for input(s): "LIPASE", "AMYLASE" in the last 168 hours. No results for input(s): "AMMONIA" in the last 168 hours. CBC: Recent Labs  Lab 06/30/22 2214 07/01/22 0500 07/02/22 0650  WBC 10.5 10.0 7.3  NEUTROABS 9.0*  --   --   HGB 12.9 11.9* 9.4*  HCT 40.1 36.9 29.1*  MCV 92.0 93.9 93.9  PLT 219 196 183   Cardiac Enzymes: No results for input(s): "CKTOTAL", "CKMB", "CKMBINDEX", "TROPONINI" in the last 168 hours. BNP: Invalid input(s): "POCBNP" CBG: No results for input(s): "GLUCAP" in the last 168 hours. D-Dimer No results for input(s): "DDIMER" in the last 72 hours. Hgb A1c No results for input(s): "HGBA1C" in the last 72 hours. Lipid Profile No results for input(s): "CHOL", "HDL", "LDLCALC", "TRIG", "CHOLHDL", "LDLDIRECT" in the last 72 hours. Thyroid function studies No results for input(s): "TSH", "T4TOTAL", "T3FREE", "THYROIDAB" in the last 72 hours.  Invalid input(s): "FREET3" Anemia work up No results for input(s): "VITAMINB12", "FOLATE",  "FERRITIN", "TIBC", "IRON", "RETICCTPCT" in the last 72 hours. Urinalysis No results found for: "COLORURINE", "APPEARANCEUR", "LABSPEC", "PHURINE", "GLUCOSEU", "HGBUR", "BILIRUBINUR", "KETONESUR", "PROTEINUR", "UROBILINOGEN", "NITRITE", "LEUKOCYTESUR" Sepsis Labs Recent Labs  Lab 06/30/22 2214 07/01/22 0500 07/02/22 0650  WBC 10.5 10.0 7.3   Microbiology No results found for this or any previous visit (from the past 240 hour(s)).   Time coordinating discharge: 32 minutes  SIGNED:   08/31/22, MD  [facility] 07/05/2022, 10:38 AM

## 2022-07-05 NOTE — Anesthesia Postprocedure Evaluation (Signed)
Anesthesia Post Note  Patient: [name]  Procedure(s) Performed: INTRAMEDULLARY (IM) NAIL INTERTROCHANTERIC RIGHT FEMUR (Right: Hip)     Patient location during evaluation: PACU Anesthesia Type: General Level of consciousness: patient cooperative Pain management: pain level controlled Vital Signs Assessment: post-procedure vital signs reviewed and stable Respiratory status: spontaneous breathing, nonlabored ventilation, respiratory function stable and patient connected to nasal cannula oxygen Cardiovascular status: blood pressure returned to baseline and stable Postop Assessment: no apparent nausea or vomiting Anesthetic complications: no   No notable events documented.  Last Vitals:  Vitals:   07/05/22 0644 07/05/22 0801  BP: (!) 164/68 (!) 152/57  Pulse: 78 72  Resp: 16 18  Temp: 36.8 C 36.8 C  SpO2: 98% 94%    Last Pain:  Vitals:   07/05/22 0801  TempSrc: Oral  PainSc: Asleep                 Breea Loncar

## 2022-07-05 NOTE — Plan of Care (Signed)
  Problem: Nutrition: Goal: Adequate nutrition will be maintained Outcome: Progressing   Problem: Coping: Goal: Level of anxiety will decrease Outcome: Progressing   Problem: Pain Managment: Goal: General experience of comfort will improve Outcome: Progressing   

## 2022-07-05 NOTE — Progress Notes (Signed)
Discharge summary packet/necessary documents provided to [facility] staff. Pt d/c to [facility] as ordered. Pt is stable, remains alert to self only in no apparent distress.

## 2022-07-05 NOTE — Discharge Instructions (Addendum)
Orthopaedic Trauma Service Discharge Instructions   General Discharge Instructions  Orthopaedic Injuries:  Right hip fracture treated with intramedullary nailing  WEIGHT BEARING STATUS: Weightbearing right leg with walker/assistance  RANGE OF MOTION/ACTIVITY:unrestricted motion of Right hip. Increase activity as tolerated   Bone health: fracture is a fragility fracture which usually suggests osteoporosis.  You will need a DEXA scan in 4-8 weeks, we can discuss this in the office  Your vitamin d levels look good  Review the following resource for additional information regarding bone health  [email or web address]  Wound Care: daily wound care as needed. Ok to leave incisions open to the air and clean wound with soap and water only   DVT/PE prophylaxis: Lovenox 30 mg subcutaneous daily x 28 days   Diet: as you were eating previously.  Can use over the counter stool softeners and bowel preparations, such as Miralax, to help with bowel movements.  Narcotics can be constipating.  Be sure to drink plenty of fluids  PAIN MEDICATION USE AND EXPECTATIONS  You have likely been given narcotic medications to help control your pain.  After a traumatic event that results in an fracture (broken bone) with or without surgery, it is ok to use narcotic pain medications to help control one's pain.  We understand that everyone responds to pain differently and each individual patient will be evaluated on a regular basis for the continued need for narcotic medications. Ideally, narcotic medication use should last no more than 6-8 weeks (coinciding with fracture healing).   As a patient it is your responsibility as well to monitor narcotic medication use and report the amount and frequency you use these medications when you come to your office visit.   We would also advise that if you are using narcotic medications, you should take a dose prior to therapy to maximize you  participation.  IF YOU ARE ON NARCOTIC MEDICATIONS IT IS NOT PERMISSIBLE TO OPERATE A MOTOR VEHICLE (MOTORCYCLE/CAR/TRUCK/MOPED) OR HEAVY MACHINERY DO NOT MIX NARCOTICS WITH OTHER CNS (CENTRAL NERVOUS SYSTEM) DEPRESSANTS SUCH AS ALCOHOL   POST-OPERATIVE OPIOID TAPER INSTRUCTIONS: It is important to wean off of your opioid medication as soon as possible. If you do not need pain medication after your surgery it is ok to stop day one. Opioids include: Codeine, Hydrocodone(Norco, Vicodin), Oxycodone(Percocet, oxycontin) and hydromorphone amongst others.  Long term and even short term use of opiods can cause: Increased pain response Dependence Constipation Depression Respiratory depression And more.  Withdrawal symptoms can include Flu like symptoms Nausea, vomiting And more Techniques to manage these symptoms Hydrate well Eat regular healthy meals Stay active Use relaxation techniques(deep breathing, meditating, yoga) Do Not substitute Alcohol to help with tapering If you have been on opioids for less than two weeks and do not have pain than it is ok to stop all together.  Plan to wean off of opioids This plan should start within one week post op of your fracture surgery  Maintain the same interval or time between taking each dose and first decrease the dose.  Cut the total daily intake of opioids by one tablet each day Next start to increase the time between doses. The last dose that should be eliminated is the evening dose.    STOP SMOKING OR USING NICOTINE PRODUCTS!!!!  As discussed nicotine severely impairs your body's ability to heal surgical and traumatic wounds but also impairs bone healing.  Wounds and bone heal by forming microscopic blood vessels (angiogenesis) and nicotine is a vasoconstrictor (essentially, shrinks  blood vessels).  Therefore, if vasoconstriction occurs to these microscopic blood vessels they essentially disappear and are unable to deliver necessary  nutrients to the healing tissue.  This is one modifiable factor that you can do to dramatically increase your chances of healing your injury.    (This means no smoking, no nicotine gum, patches, etc)  DO NOT USE NONSTEROIDAL ANTI-INFLAMMATORY DRUGS (NSAID'S)  Using products such as Advil (ibuprofen), Aleve (naproxen), Motrin (ibuprofen) for additional pain control during fracture healing can delay and/or prevent the healing response.  If you would like to take over the counter (OTC) medication, Tylenol (acetaminophen) is ok.  However, some narcotic medications that are given for pain control contain acetaminophen as well. Therefore, you should not exceed more than 4000 mg of tylenol in a day if you do not have liver disease.  Also note that there are may OTC medicines, such as cold medicines and allergy medicines that my contain tylenol as well.  If you have any questions about medications and/or interactions please ask your doctor/PA or your pharmacist.      ICE AND ELEVATE INJURED/OPERATIVE EXTREMITY  Using ice and elevating the injured extremity above your heart can help with swelling and pain control.  Icing in a pulsatile fashion, such as 20 minutes on and 20 minutes off, can be followed.    Do not place ice directly on skin. Make sure there is a barrier between to skin and the ice pack.    Using frozen items such as frozen peas works well as the conform nicely to the are that needs to be iced.  USE AN ACE WRAP OR TED HOSE FOR SWELLING CONTROL  In addition to icing and elevation, Ace wraps or TED hose are used to help limit and resolve swelling.  It is recommended to use Ace wraps or TED hose until you are informed to stop.    When using Ace Wraps start the wrapping distally (farthest away from the body) and wrap proximally (closer to the body)   Example: If you had surgery on your leg or thing and you do not have a splint on, start the ace wrap at the toes and work your way up to the thigh         If you had surgery on your upper extremity and do not have a splint on, start the ace wrap at your fingers and work your way up to the upper arm  IF YOU ARE IN A SPLINT OR CAST DO NOT [place]   If your splint gets wet for any reason please contact the office immediately. You may shower in your splint or cast as long as you keep it dry.  This can be done by wrapping in a cast cover or garbage back (or similar)  Do Not stick any thing down your splint or cast such as pencils, money, or hangers to try and scratch yourself with.  If you feel itchy take benadryl as prescribed on the bottle for itching  IF YOU ARE IN A CAM BOOT (BLACK BOOT)  You may remove boot periodically. Perform daily dressing changes as noted below.  Wash the liner of the boot regularly and wear a sock when wearing the boot. It is recommended that you sleep in the boot until told otherwise    Call office for the following: Temperature greater than 101F Persistent nausea and vomiting Severe uncontrolled pain Redness, tenderness, or signs of infection (pain, swelling, redness, odor or  green/yellow discharge around the site) Difficulty breathing, headache or visual disturbances Hives Persistent dizziness or light-headedness Extreme fatigue Any other questions or concerns you may have after discharge  In an emergency, call 911 or go to an Emergency Department at a nearby hospital  HELPFUL INFORMATION  If you had a block, it will wear off between 8-24 hrs postop typically.  This is period when your pain may go from nearly zero to the pain you would have had postop without the block.  This is an abrupt transition but nothing dangerous is happening.  You may take an extra dose of narcotic when this happens.  You should wean off your narcotic medicines as soon as you are able.  Most patients will be off or using minimal narcotics before their first postop appointment.   We suggest you use the pain medication  the first night prior to going to bed, in order to ease any pain when the anesthesia wears off. You should avoid taking pain medications on an empty stomach as it will make you nauseous.  Do not drink alcoholic beverages or take illicit drugs when taking pain medications.  In most states it is against the law to drive while you are in a splint or sling.  And certainly against the law to drive while taking narcotics.  You may return to work/school in the next couple of days when you feel up to it.   Pain medication may make you constipated.  Below are a few solutions to try in this order: Decrease the amount of pain medication if you aren't having pain. Drink lots of decaffeinated fluids. Drink prune juice and/or each dried prunes  If the first 3 don't work start with additional solutions Take Colace - an over-the-counter stool softener Take Senokot - an over-the-counter laxative Take Miralax - a stronger over-the-counter laxative     CALL THE OFFICE WITH ANY QUESTIONS OR CONCERNS: [phone]   VISIT OUR WEBSITE FOR ADDITIONAL INFORMATION: [email or web address]      Discharge Pin Site Instructions  Dress pins daily with Kerlix roll starting on POD 2. Wrap the Kerlix so that it tamps the skin down around the pin-skin interface to prevent/limit motion of the skin relative to the pin.  (Pin-skin motion is the primary cause of pain and infection related to external fixator pin sites).  Remove any crust or coagulum that may obstruct drainage with soap and water.  After POD 3, if there is no discernable drainage on the pin site dressing, the interval for change can by increased to every other day.  You may shower with the fixator, cleaning all pin sites gently with soap and water.  If you have a surgical wound this needs to be completely dry and without drainage before showering. Alternatively you can use a washcloth with soap and water and gently clean the injured extremity and external  fixator, including all pinsites and surgical wounds   The extremity can be lifted by the fixator to facilitate wound care and transfers.  Notify the office/Doctor if you experience increasing drainage, redness, or pain from a pin site, or if you notice purulent (thick, snot-like) drainage. As we discussed pin tract infections are common in this is most likely as a result of mechanical irritation from the skin pin interface. Primary treatment is hygiene and cleaning with soap and water. If this does not resolve with regular cleaning contact the office  Discharge Wound Care Instructions  Do NOT apply any ointments, solutions or lotions to  pin sites or surgical wounds.  These prevent needed drainage and even though solutions like hydrogen peroxide kill bacteria, they also damage cells lining the pin sites that help fight infection.  Applying lotions or ointments can keep the wounds moist and can cause them to breakdown and open up as well. This can increase the risk for infection. When in doubt call the office.  Surgical incisions should be dressed daily.  If any drainage is noted, use one layer of adaptic or Mepitel, then gauze, Kerlix, and an ace wrap.  [email or web address]  [email or web address]  These dressing supplies should be available at local medical supply stores ([facility], [facility], etc). They are not usually carried at places like [facility], [facility], [facility], etc  Once the incision is completely dry and without drainage, it may be left open to air out.  Showering may begin 36-48 hours later.  Cleaning gently with soap and water.  Traumatic wounds should be dressed daily as well.    One layer of adaptic, gauze, Kerlix, then ace wrap.  The adaptic  can be discontinued once the draining has ceased    If you have a wet to dry dressing: wet the gauze with saline the squeeze as much saline out so the gauze is moist (not soaking wet), place moistened gauze over wound, then place a dry gauze over the moist one, followed by Kerlix wrap, then ace wrap.

## 2022-07-05 NOTE — Progress Notes (Signed)
Auth for STR at [facility] is approved 07/05/22-- 07/07/22. [id number] [facility] liaison has been updated with this info.
